# Patient Record
Sex: Female | Born: 1970 | Race: White | Hispanic: No | Marital: Single | Smoking: Current every day smoker
Health system: Southern US, Community
[De-identification: ages and names within clinical notes are randomized; demographics above are authoritative.]

## PROBLEM LIST (undated history)

## (undated) DIAGNOSIS — F32A Depression, unspecified: Secondary | ICD-10-CM

## (undated) DIAGNOSIS — K589 Irritable bowel syndrome without diarrhea: Secondary | ICD-10-CM

## (undated) DIAGNOSIS — G43909 Migraine, unspecified, not intractable, without status migrainosus: Secondary | ICD-10-CM

## (undated) DIAGNOSIS — G4700 Insomnia, unspecified: Secondary | ICD-10-CM

## (undated) DIAGNOSIS — G8929 Other chronic pain: Secondary | ICD-10-CM

## (undated) DIAGNOSIS — M549 Dorsalgia, unspecified: Secondary | ICD-10-CM

## (undated) DIAGNOSIS — K5792 Diverticulitis of intestine, part unspecified, without perforation or abscess without bleeding: Secondary | ICD-10-CM

## (undated) DIAGNOSIS — F329 Major depressive disorder, single episode, unspecified: Secondary | ICD-10-CM

## (undated) DIAGNOSIS — F419 Anxiety disorder, unspecified: Secondary | ICD-10-CM

## (undated) DIAGNOSIS — J45909 Unspecified asthma, uncomplicated: Secondary | ICD-10-CM

## (undated) HISTORY — DX: Diverticulitis of intestine, part unspecified, without perforation or abscess without bleeding: K57.92

## (undated) HISTORY — DX: Dorsalgia, unspecified: M54.9

## (undated) HISTORY — DX: Insomnia, unspecified: G47.00

## (undated) HISTORY — PX: ABDOMINAL HYSTERECTOMY: SHX81

## (undated) HISTORY — DX: Other chronic pain: G89.29

## (undated) HISTORY — DX: Depression, unspecified: F32.A

## (undated) HISTORY — DX: Irritable bowel syndrome, unspecified: K58.9

## (undated) HISTORY — DX: Migraine, unspecified, not intractable, without status migrainosus: G43.909

## (undated) HISTORY — DX: Major depressive disorder, single episode, unspecified: F32.9

## (undated) HISTORY — DX: Anxiety disorder, unspecified: F41.9

---

## 2003-12-30 ENCOUNTER — Emergency Department: Payer: Self-pay | Admitting: Emergency Medicine

## 2004-02-15 ENCOUNTER — Emergency Department: Payer: Self-pay | Admitting: Emergency Medicine

## 2004-03-16 ENCOUNTER — Emergency Department: Payer: Self-pay | Admitting: Emergency Medicine

## 2005-02-06 ENCOUNTER — Emergency Department: Payer: Self-pay | Admitting: Emergency Medicine

## 2005-02-06 ENCOUNTER — Other Ambulatory Visit: Payer: Self-pay

## 2005-06-19 ENCOUNTER — Emergency Department: Payer: Self-pay | Admitting: Emergency Medicine

## 2005-06-20 ENCOUNTER — Ambulatory Visit: Payer: Self-pay | Admitting: Emergency Medicine

## 2005-06-28 ENCOUNTER — Ambulatory Visit: Payer: Self-pay | Admitting: Obstetrics & Gynecology

## 2006-05-14 ENCOUNTER — Emergency Department: Payer: Self-pay | Admitting: Emergency Medicine

## 2006-10-10 ENCOUNTER — Other Ambulatory Visit: Payer: Self-pay

## 2006-10-10 ENCOUNTER — Emergency Department: Payer: Self-pay

## 2007-03-19 ENCOUNTER — Emergency Department: Payer: Self-pay | Admitting: Emergency Medicine

## 2007-05-18 ENCOUNTER — Emergency Department: Payer: Self-pay | Admitting: Emergency Medicine

## 2007-06-05 ENCOUNTER — Emergency Department: Payer: Self-pay | Admitting: Emergency Medicine

## 2007-08-24 ENCOUNTER — Emergency Department: Payer: Self-pay | Admitting: Internal Medicine

## 2007-09-20 ENCOUNTER — Emergency Department: Payer: Self-pay | Admitting: Emergency Medicine

## 2008-01-30 ENCOUNTER — Emergency Department: Payer: Self-pay | Admitting: Emergency Medicine

## 2008-07-19 ENCOUNTER — Emergency Department: Payer: Self-pay

## 2009-05-13 ENCOUNTER — Emergency Department: Payer: Self-pay

## 2009-11-23 ENCOUNTER — Emergency Department: Payer: Self-pay | Admitting: Emergency Medicine

## 2010-06-29 ENCOUNTER — Emergency Department: Payer: Self-pay | Admitting: Emergency Medicine

## 2010-09-26 ENCOUNTER — Emergency Department: Payer: Self-pay | Admitting: Emergency Medicine

## 2011-02-05 ENCOUNTER — Emergency Department: Payer: Self-pay | Admitting: *Deleted

## 2011-02-05 LAB — URINALYSIS, COMPLETE
Bacteria: NONE SEEN
Glucose,UR: NEGATIVE mg/dL (ref 0–75)
Ketone: NEGATIVE
Nitrite: NEGATIVE
Protein: NEGATIVE
RBC,UR: <1 /HPF (ref 0–5)
Specific Gravity: 1.002 (ref 1.003–1.030)
Squamous Epithelial: NONE SEEN
WBC UR: <1 /HPF (ref 0–5)

## 2011-03-26 ENCOUNTER — Emergency Department: Payer: Self-pay | Admitting: *Deleted

## 2011-08-07 ENCOUNTER — Emergency Department: Payer: Self-pay | Admitting: Internal Medicine

## 2011-08-07 LAB — TROPONIN I: Troponin-I: <0.02 ng/mL

## 2011-08-07 LAB — COMPREHENSIVE METABOLIC PANEL
Albumin: 3.8 g/dL (ref 3.4–5.0)
Alkaline Phosphatase: 75 U/L (ref 50–136)
Calcium, Total: 8.5 mg/dL (ref 8.5–10.1)
Chloride: 109 mmol/L — ABNORMAL HIGH (ref 98–107)
Co2: 19 mmol/L — ABNORMAL LOW (ref 21–32)
Creatinine: 0.64 mg/dL (ref 0.60–1.30)
EGFR (Non-African Amer.): >60
Glucose: 120 mg/dL — ABNORMAL HIGH (ref 65–99)
Potassium: 3.6 mmol/L (ref 3.5–5.1)
Sodium: 140 mmol/L (ref 136–145)
Total Protein: 7.4 g/dL (ref 6.4–8.2)

## 2011-08-07 LAB — TSH: Thyroid Stimulating Horm: 0.589 u[IU]/mL

## 2011-08-07 LAB — CK TOTAL AND CKMB (NOT AT ARMC): CK-MB: <0.5 ng/mL — ABNORMAL LOW (ref 0.5–3.6)

## 2011-08-07 LAB — CBC
HCT: 42.5 % (ref 35.0–47.0)
HGB: 14.4 g/dL (ref 12.0–16.0)
MCV: 93 fL (ref 80–100)
Platelet: 183 10*3/uL (ref 150–440)
WBC: 16.1 10*3/uL — ABNORMAL HIGH (ref 3.6–11.0)

## 2012-12-03 ENCOUNTER — Emergency Department: Payer: Self-pay | Admitting: Emergency Medicine

## 2012-12-04 ENCOUNTER — Emergency Department: Payer: Self-pay | Admitting: Emergency Medicine

## 2012-12-19 LAB — COMPREHENSIVE METABOLIC PANEL
Albumin: 3.9 g/dL (ref 3.4–5.0)
BUN: 6 mg/dL — ABNORMAL LOW (ref 7–18)
Bilirubin,Total: 0.4 mg/dL (ref 0.2–1.0)
Calcium, Total: 9 mg/dL (ref 8.5–10.1)
Co2: 23 mmol/L (ref 21–32)
Creatinine: 0.9 mg/dL (ref 0.60–1.30)
EGFR (African American): >60
EGFR (Non-African Amer.): >60
Osmolality: 273 (ref 275–301)
Potassium: 3.4 mmol/L — ABNORMAL LOW (ref 3.5–5.1)
SGOT(AST): 22 U/L (ref 15–37)
SGPT (ALT): 18 U/L (ref 12–78)
Total Protein: 7.3 g/dL (ref 6.4–8.2)

## 2012-12-19 LAB — CBC
HCT: 43.9 % (ref 35.0–47.0)
MCV: 91 fL (ref 80–100)
RDW: 12.8 % (ref 11.5–14.5)

## 2012-12-19 LAB — ETHANOL
Ethanol %: 0.016 % (ref 0.000–0.080)
Ethanol: 16 mg/dL

## 2012-12-20 ENCOUNTER — Inpatient Hospital Stay: Payer: Self-pay | Admitting: Psychiatry

## 2012-12-20 LAB — URINALYSIS, COMPLETE
Bacteria: NONE SEEN
Bilirubin,UR: NEGATIVE
Blood: NEGATIVE
Glucose,UR: NEGATIVE mg/dL (ref 0–75)
Ketone: NEGATIVE
Leukocyte Esterase: NEGATIVE
Ph: 6 (ref 4.5–8.0)
Specific Gravity: 1.011 (ref 1.003–1.030)
Squamous Epithelial: 3

## 2012-12-20 LAB — DRUG SCREEN, URINE
Amphetamines, Ur Screen: NEGATIVE (ref ?–1000)
Cannabinoid 50 Ng, Ur ~~LOC~~: POSITIVE (ref ?–50)
Methadone, Ur Screen: NEGATIVE (ref ?–300)
Tricyclic, Ur Screen: NEGATIVE (ref ?–1000)

## 2013-09-17 ENCOUNTER — Emergency Department: Payer: Self-pay | Admitting: Emergency Medicine

## 2013-09-17 LAB — COMPREHENSIVE METABOLIC PANEL
ALBUMIN: 3.6 g/dL (ref 3.4–5.0)
ALK PHOS: 68 U/L
ALT: 17 U/L
AST: 23 U/L (ref 15–37)
Anion Gap: 8 (ref 7–16)
BILIRUBIN TOTAL: 0.3 mg/dL (ref 0.2–1.0)
BUN: 8 mg/dL (ref 7–18)
CO2: 24 mmol/L (ref 21–32)
Calcium, Total: 8.7 mg/dL (ref 8.5–10.1)
Chloride: 106 mmol/L (ref 98–107)
Creatinine: 0.93 mg/dL (ref 0.60–1.30)
EGFR (Non-African Amer.): >60
GLUCOSE: 103 mg/dL — AB (ref 65–99)
Osmolality: 274 (ref 275–301)
Potassium: 3.5 mmol/L (ref 3.5–5.1)
Sodium: 138 mmol/L (ref 136–145)
TOTAL PROTEIN: 7.3 g/dL (ref 6.4–8.2)

## 2013-09-17 LAB — CBC WITH DIFFERENTIAL/PLATELET
BASOS ABS: 0.1 10*3/uL (ref 0.0–0.1)
Basophil %: 1 %
EOS ABS: 0.2 10*3/uL (ref 0.0–0.7)
EOS PCT: 3.1 %
HCT: 44.4 % (ref 35.0–47.0)
HGB: 14.7 g/dL (ref 12.0–16.0)
LYMPHS PCT: 21.2 %
Lymphocyte #: 1.4 10*3/uL (ref 1.0–3.6)
MCH: 31.3 pg (ref 26.0–34.0)
MCHC: 33.1 g/dL (ref 32.0–36.0)
MCV: 95 fL (ref 80–100)
Monocyte #: 0.4 x10 3/mm (ref 0.2–0.9)
Monocyte %: 6.3 %
Neutrophil #: 4.5 10*3/uL (ref 1.4–6.5)
Neutrophil %: 68.4 %
Platelet: 188 10*3/uL (ref 150–440)
RBC: 4.69 10*6/uL (ref 3.80–5.20)
RDW: 12.9 % (ref 11.5–14.5)
WBC: 6.5 10*3/uL (ref 3.6–11.0)

## 2013-09-17 LAB — URINALYSIS, COMPLETE
Bilirubin,UR: NEGATIVE
GLUCOSE, UR: NEGATIVE mg/dL (ref 0–75)
Ketone: NEGATIVE
Nitrite: NEGATIVE
PH: 6 (ref 4.5–8.0)
Protein: NEGATIVE
Specific Gravity: 1.011 (ref 1.003–1.030)
Squamous Epithelial: 7

## 2013-09-17 LAB — PREGNANCY, URINE: Pregnancy Test, Urine: NEGATIVE m[IU]/mL

## 2013-09-17 LAB — LIPASE, BLOOD: Lipase: 219 U/L (ref 73–393)

## 2013-11-16 ENCOUNTER — Emergency Department: Payer: Self-pay | Admitting: Emergency Medicine

## 2013-11-16 LAB — COMPREHENSIVE METABOLIC PANEL
ALT: 19 U/L
AST: 17 U/L (ref 15–37)
Albumin: 3.1 g/dL — ABNORMAL LOW (ref 3.4–5.0)
Alkaline Phosphatase: 64 U/L
Anion Gap: 4 — ABNORMAL LOW (ref 7–16)
BUN: 7 mg/dL (ref 7–18)
Bilirubin,Total: 0.2 mg/dL (ref 0.2–1.0)
CALCIUM: 8.2 mg/dL — AB (ref 8.5–10.1)
CREATININE: 0.96 mg/dL (ref 0.60–1.30)
Chloride: 109 mmol/L — ABNORMAL HIGH (ref 98–107)
Co2: 30 mmol/L (ref 21–32)
EGFR (Non-African Amer.): >60
Glucose: 113 mg/dL — ABNORMAL HIGH (ref 65–99)
OSMOLALITY: 284 (ref 275–301)
Potassium: 3.8 mmol/L (ref 3.5–5.1)
Sodium: 143 mmol/L (ref 136–145)
Total Protein: 5.9 g/dL — ABNORMAL LOW (ref 6.4–8.2)

## 2013-11-16 LAB — URINALYSIS, COMPLETE
Bilirubin,UR: NEGATIVE
Glucose,UR: NEGATIVE mg/dL (ref 0–75)
Ketone: NEGATIVE
Leukocyte Esterase: NEGATIVE
Nitrite: NEGATIVE
PROTEIN: NEGATIVE
Ph: 7 (ref 4.5–8.0)
RBC,UR: 59 /HPF (ref 0–5)
SPECIFIC GRAVITY: 1.016 (ref 1.003–1.030)

## 2013-11-16 LAB — CBC WITH DIFFERENTIAL/PLATELET
BASOS ABS: 0.1 10*3/uL (ref 0.0–0.1)
Basophil %: 1.2 %
Eosinophil #: 0.2 10*3/uL (ref 0.0–0.7)
Eosinophil %: 2 %
HCT: 42.1 % (ref 35.0–47.0)
HGB: 13.7 g/dL (ref 12.0–16.0)
LYMPHS ABS: 2 10*3/uL (ref 1.0–3.6)
LYMPHS PCT: 25.3 %
MCH: 31.1 pg (ref 26.0–34.0)
MCHC: 32.6 g/dL (ref 32.0–36.0)
MCV: 96 fL (ref 80–100)
MONO ABS: 0.5 x10 3/mm (ref 0.2–0.9)
Monocyte %: 5.8 %
Neutrophil #: 5.3 10*3/uL (ref 1.4–6.5)
Neutrophil %: 65.7 %
PLATELETS: 200 10*3/uL (ref 150–440)
RBC: 4.41 10*6/uL (ref 3.80–5.20)
RDW: 12.9 % (ref 11.5–14.5)
WBC: 8 10*3/uL (ref 3.6–11.0)

## 2013-11-16 LAB — LIPASE, BLOOD: Lipase: 259 U/L (ref 73–393)

## 2013-11-25 ENCOUNTER — Emergency Department: Payer: Self-pay | Admitting: Emergency Medicine

## 2014-05-14 NOTE — H&P (Signed)
PATIENT NAME:  Jessica Mathews, Jessica J MR#:  [id number] DATE OF BIRTH:  09/14/1970  DATE OF ADMISSION:  12/20/2012  REFERRING PHYSICIAN: Emergency Room MD   ATTENDING PHYSICIAN: Jessica Reicks B. Jennet MaduroPucilowska, MD   IDENTIFYING DATA: Jessica Mathews is a 44 year old female with no past psychiatric history.   CHIEF COMPLAINT: "I was arguing with my boyfriend."  HISTORY OF PRESENT ILLNESS: Jessica Mathews has no psychiatric past. She was brought to the emergency room by police after her children called them. She reportedly was arguing with her boyfriend and made some statements that were taken as suicidal threats. The patient adamantly denies any intention or plans to hurt to herself or anybody else. She denies ever mentioning suicide. She was also noted to have a cut on her wrist, which was interpreted as a suicide attempt. It looks old. The patient cut it when she was patching a broken window with some sheets and plastic bags; otherwise, the house would be cold. She denies any symptoms of depression, anxiety, psychosis. She denies symptoms suggestive of bipolar mania. She does not use alcohol, illicit drugs, or prescription pills. She reports that she has been with her boyfriend, who is [age] years old, for 3 years. They do better when they do not live together. He is a drinker and when drunk he can be difficult. She moved in with him together with her daughter, who is 45 years old and has an 5263-month baby. Now her older daughter, who is 3624, also moved in with her 2 children, ages 1073 and 315. Both girls work. 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 The boyfriend is on the lease. Yesterday's argument was about them staying together, and the patient decided that probably it is better when they have a long distance relationship. She intends to switch lease into her daughter's name, and the girls will pay the rent. The boyfriend will go to live with his mother. At least this is the plan for today.   PAST PSYCHIATRIC HISTORY: She has never been  hospitalized. No suicide attempts. No substance abuse treatment.   FAMILY PSYCHIATRIC HISTORY: None reported.   PAST MEDICAL HISTORY: None.   ALLERGIES: ZITHROMAX.   MEDICATIONS ON ADMISSION: None.   SOCIAL HISTORY: She used to be a LawyerCNA and medication nurse. She got into some trouble being at the wrong place at the wrong time, charged with felony, lost her licenses. 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 She has no health insurance.   REVIEW OF SYSTEMS:    CONSTITUTIONAL: No fevers or chills. No weight changes.  EYES: No double or blurred vision.  ENT: No hearing loss. RESPIRATORY: No shortness of breath.  CARDIOVASCULAR: No chest pain or orthopnea.  GASTROINTESTINAL: No abdominal pain, nausea, vomiting, or diarrhea.  GENITOURINARY: No incontinence or frequency.  ENDOCRINE: No heat or cold intolerance.  LYMPHATIC: No anemia or easy bruising.  INTEGUMENTARY: No acne or rash.  MUSCULOSKELETAL: No muscle or joint pain.  NEUROLOGIC: No tingling or weakness.  PSYCHIATRIC: See history of present illness for details.   PHYSICAL EXAMINATION: VITAL SIGNS: Blood pressure 140/91, pulse 69, respirations 20, temperature 98.3.  GENERAL: This is a well-developed female in no acute distress.  HEENT: The pupils are equal, round, and reactive to light. Sclerae anicteric.  NECK: Supple. No thyromegaly.  LUNGS: Clear to auscultation. No dullness to percussion.  HEART: Regular rhythm and rate. No murmurs, rubs, or gallops.  ABDOMEN: Soft, nontender, nondistended. Positive bowel sounds.  MUSCULOSKELETAL: Normal muscle strength in all extremities.  SKIN: No rashes or bruises.  LYMPHATIC: No cervical adenopathy.  NEUROLOGIC: Cranial nerves II through XII are intact.   LABORATORY DATA: Chemistries are within normal limits except for potassium of 3.4. Blood alcohol level is 0.016. LFTs within normal limits. Urine drug screen positive for benzodiazepines and cannabinoids. CBC within  normal limits. Urinalysis is not suggestive of urinary tract infection. Serum acetaminophen and salicylates are low.   MENTAL STATUS EXAMINATION: The patient is alert and oriented to person, place, time, and situation. She is pleasant, polite, and cooperative. She maintains good eye contact. She is well groomed and casually dressed. Her speech is of normal rhythm, rate, and volume. Mood is good with full affect. Thought process is logical and goal oriented. Thought content: She denies suicidal or homicidal ideation. There are no delusions or paranoia. There are no auditory or visual hallucinations. Her cognition is grossly intact. She registers 3 out of 3 and recalls 3 out of 3 objects after 5 minutes. She can spell "world" forwards and backwards. She knows the current president. Her insight and judgment are fair.   SUICIDE RISK ASSESSMENT: This is a patient with no past psychiatric history who was brought to the hospital for making threats that were interpreted as suicidal in the context of relationship problems. She adamantly denies any thoughts, intention, or plans of hurting herself or others. She is forward thinking. She is a loving mother and grandmother.   DIAGNOSES: AXIS I: Adjustment disorder with depressed mood; benzodiazepine abuse; cannabis abuse.  AXIS II: Deferred.  AXIS III: Deferred.  AXIS IV: Relationship problems.  AXIS V: Global Assessment of Functioning 60.   PLAN: The patient was admitted to [facility] Medicine Unit for safety, stabilization, and medication management. She was initially placed on suicide precautions and was closely monitored for any unsafe behaviors. She underwent full psychiatric and risk assessment. She received pharmacotherapy, individual and group psychotherapy, substance abuse counseling, and support from therapeutic milieu.  1.  Suicidal ideation: The patient adamantly denies. There is a scratch on her wrist that looks old and  the patient denies that it was self-inflicted, rather, an accident.  2.  Substance abuse: The patient admits that she drinks socially. She smokes a lot of cigarettes and at times marijuana. She was positive for benzodiazepines. She says that when upset yesterday, she was given 1 Xanax by a friend. Denies problems with substances and declines substance abuse treatment.  3.  Disposition: She is discharged to home with her children.  4.  Medications at the time of discharge: None.  5.  She was given information about [employer].     ____________________________ Ellin Goodie. Jennet Maduro, MD jbp:jcm D: 12/20/2012 14:38:19 ET T: 12/20/2012 15:01:46 ET JOB#: [id number]  cc: Jaysean Manville B. Jennet Maduro, MD, <Dictator> Shari Prows MD ELECTRONICALLY SIGNED 12/23/2012 8:08

## 2014-09-30 ENCOUNTER — Encounter (HOSPITAL_COMMUNITY): Payer: Self-pay | Admitting: *Deleted

## 2014-09-30 DIAGNOSIS — R109 Unspecified abdominal pain: Secondary | ICD-10-CM | POA: Insufficient documentation

## 2014-09-30 DIAGNOSIS — J45909 Unspecified asthma, uncomplicated: Secondary | ICD-10-CM | POA: Insufficient documentation

## 2014-09-30 DIAGNOSIS — R112 Nausea with vomiting, unspecified: Secondary | ICD-10-CM | POA: Insufficient documentation

## 2014-09-30 DIAGNOSIS — R6883 Chills (without fever): Secondary | ICD-10-CM | POA: Insufficient documentation

## 2014-09-30 DIAGNOSIS — M545 Low back pain: Secondary | ICD-10-CM | POA: Insufficient documentation

## 2014-09-30 DIAGNOSIS — Z720 Tobacco use: Secondary | ICD-10-CM | POA: Insufficient documentation

## 2014-09-30 NOTE — ED Notes (Signed)
Pt c./o back pain, hematuria and hx of kidney stones.

## 2014-10-01 ENCOUNTER — Emergency Department (HOSPITAL_COMMUNITY): Payer: Self-pay

## 2014-10-01 ENCOUNTER — Emergency Department (HOSPITAL_COMMUNITY)
Admission: EM | Admit: 2014-10-01 | Discharge: 2014-10-01 | Disposition: A | Discharge status: Home / Self Care | Payer: Self-pay | Attending: Emergency Medicine | Admitting: Emergency Medicine

## 2014-10-01 ENCOUNTER — Encounter (HOSPITAL_COMMUNITY): Payer: Self-pay

## 2014-10-01 DIAGNOSIS — R109 Unspecified abdominal pain: Secondary | ICD-10-CM

## 2014-10-01 HISTORY — DX: Unspecified asthma, uncomplicated: J45.909

## 2014-10-01 LAB — URINALYSIS, ROUTINE W REFLEX MICROSCOPIC
Bilirubin Urine: NEGATIVE
Glucose, UA: NEGATIVE mg/dL
Ketones, ur: NEGATIVE mg/dL
NITRITE: NEGATIVE
PROTEIN: NEGATIVE mg/dL
SPECIFIC GRAVITY, URINE: 1.006 (ref 1.005–1.030)
UROBILINOGEN UA: 0.2 mg/dL (ref 0.0–1.0)
pH: 6 (ref 5.0–8.0)

## 2014-10-01 LAB — I-STAT CHEM 8, ED
BUN: 11 mg/dL (ref 6–20)
CALCIUM ION: 1.17 mmol/L (ref 1.12–1.23)
CREATININE: 0.9 mg/dL (ref 0.44–1.00)
Chloride: 101 mmol/L (ref 101–111)
GLUCOSE: 84 mg/dL (ref 65–99)
HCT: 42 % (ref 36.0–46.0)
HEMOGLOBIN: 14.3 g/dL (ref 12.0–15.0)
Potassium: 3.8 mmol/L (ref 3.5–5.1)
Sodium: 139 mmol/L (ref 135–145)
TCO2: 25 mmol/L (ref 0–100)

## 2014-10-01 LAB — URINE MICROSCOPIC-ADD ON

## 2014-10-01 MED ORDER — HYDROCODONE-ACETAMINOPHEN 5-325 MG PO TABS: 1.0000 | ORAL_TABLET | Freq: Two times a day (BID) | ORAL | Status: DC | PRN

## 2014-10-01 MED ORDER — SODIUM CHLORIDE 0.9 % IV BOLUS (SEPSIS)
1000.0000 mL | Freq: Once | INTRAVENOUS | Status: AC
  Administered 2014-10-01: 1000 mL via INTRAVENOUS

## 2014-10-01 MED ORDER — KETOROLAC TROMETHAMINE 30 MG/ML IJ SOLN
30.0000 mg | Freq: Once | INTRAMUSCULAR | Status: AC
  Administered 2014-10-01: 30 mg via INTRAVENOUS
  Filled 2014-10-01: qty 1

## 2014-10-01 MED ORDER — ONDANSETRON HCL 4 MG/2ML IJ SOLN
4.0000 mg | Freq: Once | INTRAMUSCULAR | Status: AC
  Administered 2014-10-01: 4 mg via INTRAVENOUS
  Filled 2014-10-01: qty 2

## 2014-10-01 MED ORDER — MORPHINE SULFATE (PF) 4 MG/ML IV SOLN
6.0000 mg | Freq: Once | INTRAVENOUS | Status: AC
  Administered 2014-10-01: 6 mg via INTRAVENOUS
  Filled 2014-10-01: qty 2

## 2014-10-01 NOTE — Discharge Instructions (Signed)
Flank Pain Ms. Jessica Mathews, You do not have any kidney stones.  See urology within 3 days for close follow up.  If symptoms worsen, come back to the ED immediately.  Thank you. Flank pain is pain in your side. The flank is the area of your side between your upper belly (abdomen) and your back. Pain in this area can be caused by many different things. HOME CARE Home care and treatment will depend on the cause of your pain.  Rest as told by your doctor.  Drink enough fluids to keep your pee (urine) clear or pale yellow.  Only take medicine as told by your doctor.  Tell your doctor about any changes in your pain.  Follow up with your doctor. GET HELP RIGHT AWAY IF:   Your pain does not get better with medicine.   You have new symptoms or your symptoms get worse.  Your pain gets worse.   You have belly (abdominal) pain.   You are short of breath.   You always feel sick to your stomach (nauseous).   You keep throwing up (vomiting).   You have puffiness (swelling) in your belly.   You feel light-headed or you pass out (faint).   You have blood in your pee.  You have a fever or lasting symptoms for more than 2-3 days.  You have a fever and your symptoms suddenly get worse. MAKE SURE YOU:   Understand these instructions.  Will watch your condition.  Will get help right away if you are not doing well or get worse. Document Released: 10/18/2007 Document Revised: 05/25/2013 Document Reviewed: 08/23/2011 [facility] Patient Information 2015 [place], [place]. This information is not intended to replace advice given to you by your health care provider. Make sure you discuss any questions you have with your health care provider.

## 2014-10-01 NOTE — ED Notes (Signed)
Pt verbalized understanding of d/c instructions and has no further questions. Pt stable and NAD. Pt discharged home with family member driving.

## 2014-10-01 NOTE — ED Provider Notes (Signed)
CSN: [id number]     Arrival date & time 09/30/14  2346 History   This chart was scribed for Tomasita Crumble, MD by Evon Slack, ED Scribe. This patient was seen in room A03C/A03C and the patient's care was started at 3:11 AM.     Chief Complaint  Patient presents with  . Back Pain  . Hematuria   Patient is a 44 y.o. female presenting with back pain. The history is provided by the patient. No language interpreter was used.  Back Pain Location:  Lumbar spine Pain severity:  Moderate Onset quality:  Gradual Duration:  3 days Timing:  Constant Relieved by:  None tried  HPI Comments: Jessica Mathews is a 44 y.o. female who presents to the Emergency Department complaining of right sided low back pain onset 3 days prior. Pt reports associated hematuria. She states that when she urinates it is a sharp pressure. Pt reports chills, nausea and vomiting. Pt states she has a Hx of kidney stones and the pain feels similar. Denies Hx previous back surgeries.   Past Medical History  Diagnosis Date  . Asthma    Past Surgical History  Procedure Laterality Date  . Abdominal hysterectomy     No family history on file. Social History  Substance Use Topics  . Smoking status: Current Every Day Smoker -- 0.50 packs/day    Types: Cigarettes  . Smokeless tobacco: None  . Alcohol Use: No   OB History    No data available     Review of Systems  Constitutional: Positive for chills.  Gastrointestinal: Positive for nausea and vomiting.  Genitourinary: Positive for hematuria.  Musculoskeletal: Positive for back pain.  All other systems reviewed and are negative.    Allergies  Zithromax  Home Medications   Prior to Admission medications   Not on File   BP 120/82 mmHg  Pulse 71  Temp(Src) 98.5 F (36.9 C) (Oral)  Resp 16  Ht 5\' 5"  (1.651 m)  Wt 177 lb (80.287 kg)  BMI 29.45 kg/m2  SpO2 96%   Physical Exam  Constitutional: She is oriented to person, place, and time. She appears  well-developed and well-nourished. No distress.  HENT:  Head: Normocephalic and atraumatic.  Eyes: Conjunctivae and EOM are normal.  Neck: Neck supple. No tracheal deviation present.  Cardiovascular: Normal rate.   Pulmonary/Chest: Effort normal. No respiratory distress.  Abdominal: There is CVA tenderness (Right).  Musculoskeletal: Normal range of motion.  Neurological: She is alert and oriented to person, place, and time.  Skin: Skin is warm and dry.  Psychiatric: She has a normal mood and affect. Her behavior is normal.  Nursing note and vitals reviewed.   ED Course  Procedures (including critical care time) DIAGNOSTIC STUDIES: Oxygen Saturation is 96% on RA, adequate by my interpretation.    COORDINATION OF CARE: 3:14 AM-Discussed treatment plan with pt at bedside and pt agreed to plan.     Labs Review Labs Reviewed  URINALYSIS, ROUTINE W REFLEX MICROSCOPIC (NOT AT [facility]) - Abnormal; Notable for the following:    Hgb urine dipstick LARGE (*)    Leukocytes, UA TRACE (*)    All other components within normal limits  URINE MICROSCOPIC-ADD ON  I-STAT CHEM 8, ED    Imaging Review Ct Renal Stone Study  10/01/2014   CLINICAL DATA:  Right flank pain and gross hematuria. History of renal stones.  EXAM: CT ABDOMEN AND PELVIS WITHOUT CONTRAST  TECHNIQUE: Multidetector CT imaging of the abdomen and pelvis  was performed following the standard protocol without IV contrast.  COMPARISON:  None.  FINDINGS: 4 mm nodule in the left lung base. If the patient is at high risk for bronchogenic carcinoma, follow-up chest CT at 1 year is recommended. If the patient is at low risk, no follow-up is needed. This recommendation follows the consensus statement: Guidelines for Management of Small Pulmonary Nodules Detected on CT Scans: A Statement from the Fleischner Society as published in Radiology 2005; 237:395-400.  Kidneys are symmetrical in size and shape. No hydronephrosis or hydroureter. No renal,  ureteral, or bladder stones.  The unenhanced appearance of the liver, spleen, gallbladder, pancreas, adrenal glands, abdominal aorta, inferior vena cava, and retroperitoneal lymph nodes is unremarkable. Stomach, small bowel, and colon are not abnormally distended. Stool fills the colon. No free air or free fluid in the abdomen. Abdominal wall musculature appears intact.  Pelvis: Uterus is surgically absent. Appendix is normal. Diverticula in the sigmoid colon without evidence of diverticulitis. No free or loculated pelvic fluid collections. No pelvic mass or lymphadenopathy. No destructive bone lesions.  IMPRESSION: No renal or ureteral stone or obstruction.   Electronically Signed   By: Burman Nieves M.D.   On: 10/01/2014 04:04      EKG Interpretation None      MDM   Final diagnoses:  None    Patient presents to the ED for r flank pain and is concerned for kidney stones.  She states this feels like past kidney stones.  She has never been here and normally goes to [facility].  She was given IVF, zofran, and morphine and toradol for control.  CT is negative for kidney stones.  She continues to ask for more pain medication.  She was given another dose of morphine.    She was advised and CT findings. Urology follow-up be given to her. Patient is requesting pain medication to go home with, will give 6 tablets of Norco. She otherwise appears well-developed acute distress. Vital signs within her normal limits and she is safe for discharge.  I personally performed the services described in this documentation, which was scribed in my presence. The recorded information has been reviewed and is accurate.     Tomasita Crumble, MD 10/01/14 [phone]

## 2014-10-30 ENCOUNTER — Emergency Department (HOSPITAL_COMMUNITY): Payer: No Typology Code available for payment source

## 2014-10-30 ENCOUNTER — Encounter (HOSPITAL_COMMUNITY): Payer: Self-pay | Admitting: Nurse Practitioner

## 2014-10-30 ENCOUNTER — Emergency Department (HOSPITAL_COMMUNITY)
Admission: EM | Admit: 2014-10-30 | Discharge: 2014-10-30 | Disposition: A | Discharge status: Home / Self Care | Payer: No Typology Code available for payment source | Attending: Emergency Medicine | Admitting: Emergency Medicine

## 2014-10-30 DIAGNOSIS — S4992XA Unspecified injury of left shoulder and upper arm, initial encounter: Secondary | ICD-10-CM | POA: Insufficient documentation

## 2014-10-30 DIAGNOSIS — Z720 Tobacco use: Secondary | ICD-10-CM | POA: Diagnosis not present

## 2014-10-30 DIAGNOSIS — J45909 Unspecified asthma, uncomplicated: Secondary | ICD-10-CM | POA: Diagnosis not present

## 2014-10-30 DIAGNOSIS — Y92410 Unspecified street and highway as the place of occurrence of the external cause: Secondary | ICD-10-CM | POA: Diagnosis not present

## 2014-10-30 DIAGNOSIS — S199XXA Unspecified injury of neck, initial encounter: Secondary | ICD-10-CM | POA: Insufficient documentation

## 2014-10-30 DIAGNOSIS — Y9389 Activity, other specified: Secondary | ICD-10-CM | POA: Diagnosis not present

## 2014-10-30 DIAGNOSIS — Y998 Other external cause status: Secondary | ICD-10-CM | POA: Insufficient documentation

## 2014-10-30 DIAGNOSIS — S3992XA Unspecified injury of lower back, initial encounter: Secondary | ICD-10-CM | POA: Insufficient documentation

## 2014-10-30 DIAGNOSIS — M25512 Pain in left shoulder: Secondary | ICD-10-CM

## 2014-10-30 MED ORDER — DIAZEPAM 5 MG PO TABS: 5.0000 mg | ORAL_TABLET | Freq: Two times a day (BID) | ORAL | Status: DC | PRN

## 2014-10-30 MED ORDER — IBUPROFEN 800 MG PO TABS: 800.0000 mg | ORAL_TABLET | Freq: Three times a day (TID) | ORAL | Status: DC

## 2014-10-30 MED ORDER — OXYCODONE-ACETAMINOPHEN 5-325 MG PO TABS
2.0000 | ORAL_TABLET | Freq: Once | ORAL | Status: AC
  Administered 2014-10-30: 2 via ORAL
  Filled 2014-10-30: qty 2

## 2014-10-30 MED ORDER — DIAZEPAM 5 MG PO TABS
10.0000 mg | ORAL_TABLET | Freq: Once | ORAL | Status: AC
Start: 2014-10-30 — End: 2014-10-30
  Administered 2014-10-30: 10 mg via ORAL
  Filled 2014-10-30: qty 2

## 2014-10-30 NOTE — Discharge Instructions (Signed)
1. Medications: alternate naprosyn and tylenol for pain control, Use valium for muscle spasms, usual home medications 2. Treatment: rest, ice, elevate and use brace, drink plenty of fluids, gentle stretching 3. Follow Up: Please followup with orthopedics as directed or your PCP in 1 week if no improvement for discussion of your diagnoses and further evaluation after today's visit; if you do not have a primary care doctor use the resource guide provided to find one; Please return to the ER for worsening symptoms or other concerns    Cryotherapy Cryotherapy is when you put ice on your injury. Ice helps lessen pain and puffiness (swelling) after an injury. Ice works the best when you start using it in the first 24 to 48 hours after an injury. HOME CARE  Put a dry or damp towel between the ice pack and your skin.  You may press gently on the ice pack.  Leave the ice on for no more than 10 to 20 minutes at a time.  Check your skin after 5 minutes to make sure your skin is okay.  Rest at least 20 minutes between ice pack uses.  Stop using ice when your skin loses feeling (numbness).  Do not use ice on someone who cannot tell you when it hurts. This includes small children and people with memory problems (dementia). GET HELP RIGHT AWAY IF:  You have white spots on your skin.  Your skin turns blue or pale.  Your skin feels waxy or hard.  Your puffiness gets worse. MAKE SURE YOU:   Understand these instructions.  Will watch your condition.  Will get help right away if you are not doing well or get worse.   This information is not intended to replace advice given to you by your health care provider. Make sure you discuss any questions you have with your health care provider.   Document Released: 06/27/2007 Document Revised: 04/02/2011 Document Reviewed: 08/31/2010 Elsevier Interactive Patient Education 2016 [employer].    Emergency Department Resource Guide 1) Find a Doctor  and Pay Out of Pocket Although you won't have to find out who is covered by your insurance plan, it is a good idea to ask around and get recommendations. You will then need to call the office and see if the doctor you have chosen will accept you as a new patient and what types of options they offer for patients who are self-pay. Some doctors offer discounts or will set up payment plans for their patients who do not have insurance, but you will need to ask so you aren't surprised when you get to your appointment.  2) Contact Your Local Health Department Not all health departments have doctors that can see patients for sick visits, but many do, so it is worth a call to see if yours does. If you don't know where your local health department is, you can check in your phone book. The CDC also has a tool to help you locate your state's health department, and many state websites also have listings of all of their local health departments.  3) Find a Walk-in Clinic If your illness is not likely to be very severe or complicated, you may want to try a walk in clinic. These are popping up all over the country in pharmacies, drugstores, and shopping centers. They're usually staffed by nurse practitioners or physician assistants that have been trained to treat common illnesses and complaints. They're usually fairly quick and inexpensive. However, if you have serious medical  issues or chronic medical problems, these are probably not your best option.  No Primary Care Doctor: - Call Health Connect at  [phone] - they can help you locate a primary care doctor that  accepts your insurance, provides certain services, etc. - Physician Referral Service- [phone]  Chronic Pain Problems: Organization         Address  Phone   Notes  [facility]  [phone] Patients need to be referred by their primary care doctor.   Medication Assistance: Organization         Address  Phone    Notes  [facility] Medication [facility] [address] [place], [place] [address] [phone] --Must be a resident of [facility] -- Must have NO insurance coverage whatsoever (no Medicaid/ Medicare, etc.) -- The pt. MUST have a primary care doctor that directs their care regularly and follows them in the community   Med[facility]ist  [phone]   [employer]  [phone]    Agencies that provide inexpensive medical care: Organization         Address  Phone   Notes  [place]  [phone]   [facility]    [phone]   [facility] [place], [place] [address] [phone]   [place] [address] [place] [phone]   Planned Parenthood    ([phone]   [facility]    [phone]   [place] and [address], [place] Phone:  [phone], Fax:  [phone] Hours of Operation:  9 am - 6 pm, M-F.  Also accepts Medicaid/Medicare and self-pay.  [facility] for [place], [address], [place] Phone: [phone], Fax: [phone]. Hours of Operation:  8:30 am - 5:30 pm, M-F.  Also accepts Medicaid and self-pay.  [facility] [place] [address], [place] Phone: [phone]   [place], [place], [place] [phone], Ext. [phone] Mondays & Thursdays: 7-9 AM.  First 15 patients are seen on a first come, first serve basis.    [place] Providers:  Organization         Address  Phone   Notes  [facility] [address], [place] [phone] Also accepts self-pay patients.  [facility] [address], [place]  [phone]   [place], [address], [place] [phone]   [facility] [address] [place] [phone]   Lucianne Lei [address], [place]   [phone] Only accepts [place] Access [place] patients after they have their name applied to their card.   Self-Pay (no insurance) in [place]:  Organization         Address  Phone   Notes  Sickle Cell Patients, [facility] [place] [phone]   [facility] [place] [phone]   [facility] [place]  [address], [address], [place] [phone]   [facility]/Dr. Osei-Bonsu  [address], [place] or [address], [place] [phone] Phone number for both West and [place] locations is the  same.  [facility] [address] [place] [phone]   [facility] [address] [place] or [address] [phone]   [facility] [address], [place] [phone], phone; [phone], fax Sees patients 1st and 3rd Saturday of every month.  Must not qualify for public or private insurance (i.e. Medicaid, Medicare, [place] Health Choice, Veterans' Benefits)  Household income should be no more than 200% of the poverty level The clinic cannot treat you if you are pregnant or think you are pregnant  Sexually transmitted diseases are not treated at the clinic.    Dental Care: Organization         Address  Phone  Notes  [facility] Department of [facility] [place] [phone] Accepts children up to age 31 who are enrolled in [place] or [place]; pregnant women with a Medicaid card; and children who have applied for Medicaid or [place] Health Choice, but were declined, whose parents can pay a reduced fee at time of service.  [facility]  [address], [place] [phone] Accepts children up to age 34 who are enrolled in [place] or [place]; pregnant women with a Medicaid card; and children who have applied for Medicaid or [place] Health Choice, but were declined, whose parents can pay a reduced fee at time of service.  [place] Adult Dental Access PROGRAM  [place] [phone] Patients are seen by appointment only. Walk-ins are not accepted. [place] will see patients 70 years of age and older. Monday - Tuesday (8am-5pm) Most Wednesdays (8:30-5pm) $30 per visit, cash only  [facility] Adult Dental Access PROGRAM  [address], [facility] [phone] Patients are seen by appointment only. Walk-ins are not accepted. [place] will see patients 38 years of age and older. One Wednesday Evening (Monthly: Volunteer Based).  $30 per visit, cash only  [place]  [phone] for adults; Children under age 66, call Graduate Pediatric Dentistry at [phone]. Children aged 18-14, please call [phone] to request a pediatric application.  Dental services are provided in all areas of dental care including fillings, crowns and bridges, complete and partial dentures, implants, gum treatment, root canals, and extractions. Preventive care is also provided. Treatment is provided to both adults and children. Patients are selected via a lottery and there is often a waiting list.   [facility] [address], [place]  [phone] [email or web address]   [facility] [address] [place], [place] [phone], Ext. [phone] Second and Fourth Thursday of each month, opens at 6:30 AM; Clinic ends at 9 AM.  Patients are seen on a first-come first-served basis, and a limited number are seen during each clinic.   [facility]  [address] Hill[place] Danker [place], [place] [phone]   Eligibility Requirements You must have lived in  [place], [place], or [place] counties for at least the last three months.   You cannot be eligible for state or federal sponsored [employer], including [employer], [place], or [employer].   You generally cannot be eligible for healthcare insurance through your employer.    How to apply: Eligibility screenings are held every Tuesday and Wednesday afternoon from 1:00 pm until 4:00 pm. You do not need an appointment for  the interview!  [facility] [address], [place], [place]   [place]  [place] Department  [place]  [phone]    Behavioral Health Resources in the Community: Intensive Outpatient Programs Organization         Address  Phone  Notes  [place]. [address], [place], [place] [phone]   [facility] [address] [place], [place]   ADS: Alcohol & Drug Svcs [address] [place], [place]   [place] [address]  [place], [place] or [phone]   Substance Abuse Resources Organization         Address  Phone  Notes  Al[facility] Drug Services  [phone]   [place]  [phone]   [facility] [place]   Chinita Pester  [phone]   Residential & Outpatient Substance Abuse Program  [phone]   Psychological Services Organization         Address  Phone  Notes  [facility] [place]  [place]  [phone]   [place] [address], [place] or [phone]    Mobile Crisis Teams Organization         Address  Phone  Notes  [facility], Mobile Crisis Care Unit  [phone]   [facility]  [address] [place], [place]   [place] [address], [place] [phone]    Self-Help/Support Groups Organization         Address  Phone             Notes  [place]. of [place] - variety of support groups  [place] Call for more information  Narcotics Anonymous (NA), [facility] [address], [employer] [place]  2 meetings at this location   Special educational needs teacher         Address  Phone  Notes  [facility]idential [facility] [place],    [place]  [phone]   [facility]  [address], [place] [address], [place], [place]   [place], [place] [phone] Admissions: 8am-3pm M-F  Incentives Substance [place] [address],    [place], [place] [phone]   [facility] [address] [place], [place], [place]   [facility] [address],  [place], [place]   [facility] - Intensive Outpatient [address]een [address], [place], [place]   [facility].,  [place], [place] [phone] or [phone]   [facility] ([facility]) [address], [place], [place] Accepts Medicaid  [facility] [address]  [place] [phone] Substance Abuse/Addiction Treatment   [facility] Organization         Address  Phone  Notes  [facility]  [phone]   Domenic Schwab, PhD [address] Arlis [place], [place]   [phone] or [phone]   [facility], [place] [phone]   [place] [address], [place], [place] [phone] Insurance/Medicaid/sponsorship through [employer] and Families [address]  [place], [place] [phone] [place], [place] [phone]    Dr. Adele Schilder  [phone]   [facility] Dept. 1) [address] [place] 2) [place] 3)  [place] [address], [place] [phone]  [phone]   [place] [phone] or [phone] (After Hours)
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
## 2014-10-30 NOTE — ED Notes (Signed)
Patient transported to X-ray 

## 2015-03-09 ENCOUNTER — Emergency Department
Admission: EM | Admit: 2015-03-09 | Discharge: 2015-03-09 | Disposition: A | Discharge status: Home / Self Care | Payer: Self-pay | Attending: Emergency Medicine | Admitting: Emergency Medicine

## 2015-03-09 ENCOUNTER — Emergency Department: Payer: Self-pay

## 2015-03-09 ENCOUNTER — Encounter: Payer: Self-pay | Admitting: Emergency Medicine

## 2015-03-09 DIAGNOSIS — F419 Anxiety disorder, unspecified: Secondary | ICD-10-CM | POA: Insufficient documentation

## 2015-03-09 DIAGNOSIS — Y998 Other external cause status: Secondary | ICD-10-CM | POA: Insufficient documentation

## 2015-03-09 DIAGNOSIS — R0789 Other chest pain: Secondary | ICD-10-CM

## 2015-03-09 DIAGNOSIS — S299XXA Unspecified injury of thorax, initial encounter: Secondary | ICD-10-CM | POA: Insufficient documentation

## 2015-03-09 DIAGNOSIS — F17210 Nicotine dependence, cigarettes, uncomplicated: Secondary | ICD-10-CM | POA: Insufficient documentation

## 2015-03-09 DIAGNOSIS — Y9389 Activity, other specified: Secondary | ICD-10-CM | POA: Insufficient documentation

## 2015-03-09 DIAGNOSIS — Z79899 Other long term (current) drug therapy: Secondary | ICD-10-CM | POA: Insufficient documentation

## 2015-03-09 DIAGNOSIS — Y92008 Other place in unspecified non-institutional (private) residence as the place of occurrence of the external cause: Secondary | ICD-10-CM | POA: Insufficient documentation

## 2015-03-09 DIAGNOSIS — X58XXXA Exposure to other specified factors, initial encounter: Secondary | ICD-10-CM | POA: Insufficient documentation

## 2015-03-09 LAB — CBC
HCT: 42.9 % (ref 35.0–47.0)
HEMOGLOBIN: 14.3 g/dL (ref 12.0–16.0)
MCH: 30.7 pg (ref 26.0–34.0)
MCHC: 33.3 g/dL (ref 32.0–36.0)
MCV: 92.2 fL (ref 80.0–100.0)
Platelets: 166 10*3/uL (ref 150–440)
RBC: 4.66 MIL/uL (ref 3.80–5.20)
RDW: 12.8 % (ref 11.5–14.5)
WBC: 7 10*3/uL (ref 3.6–11.0)

## 2015-03-09 LAB — COMPREHENSIVE METABOLIC PANEL
ALK PHOS: 53 U/L (ref 38–126)
ALT: 15 U/L (ref 14–54)
ANION GAP: 7 (ref 5–15)
AST: 19 U/L (ref 15–41)
Albumin: 4.1 g/dL (ref 3.5–5.0)
BILIRUBIN TOTAL: 0.5 mg/dL (ref 0.3–1.2)
BUN: 10 mg/dL (ref 6–20)
CALCIUM: 9.3 mg/dL (ref 8.9–10.3)
CO2: 26 mmol/L (ref 22–32)
Chloride: 108 mmol/L (ref 101–111)
Creatinine, Ser: 0.92 mg/dL (ref 0.44–1.00)
Glucose, Bld: 102 mg/dL — ABNORMAL HIGH (ref 65–99)
Potassium: 4 mmol/L (ref 3.5–5.1)
SODIUM: 141 mmol/L (ref 135–145)
TOTAL PROTEIN: 7 g/dL (ref 6.5–8.1)

## 2015-03-09 LAB — LIPASE, BLOOD: Lipase: 46 U/L (ref 11–51)

## 2015-03-09 LAB — TROPONIN I

## 2015-03-09 MED ORDER — MORPHINE SULFATE (PF) 4 MG/ML IV SOLN
INTRAVENOUS | Status: AC
  Administered 2015-03-09: 6 mg via INTRAMUSCULAR
  Filled 2015-03-09: qty 2

## 2015-03-09 MED ORDER — MORPHINE SULFATE (PF) 4 MG/ML IV SOLN
6.0000 mg | Freq: Once | INTRAVENOUS | Status: AC
  Administered 2015-03-09: 6 mg via INTRAMUSCULAR

## 2015-03-09 MED ORDER — ONDANSETRON 4 MG PO TBDP
4.0000 mg | ORAL_TABLET | Freq: Once | ORAL | Status: AC
  Administered 2015-03-09: 4 mg via ORAL

## 2015-03-09 MED ORDER — TRAMADOL HCL 50 MG PO TABS: 50.0000 mg | ORAL_TABLET | Freq: Four times a day (QID) | ORAL | Status: DC | PRN

## 2015-03-09 MED ORDER — ONDANSETRON 4 MG PO TBDP
ORAL_TABLET | ORAL | Status: AC
  Filled 2015-03-09: qty 1

## 2015-03-09 NOTE — Discharge Instructions (Signed)

## 2015-03-09 NOTE — ED Notes (Signed)
Pt presents with abdominal pain for three days with some nausea and vomiting.

## 2015-03-09 NOTE — ED Provider Notes (Addendum)
[facility] Emergency Department Provider Note  ____________________________________________   I have reviewed the triage vital signs and the nursing notes.   HISTORY  Chief Complaint Abdominal Pain    HPI Jessica Mathews is a 45 y.o. female presents today complaining of rib pain. She states she was leaning on the porch, and she put her feet up and she leaned very heavily on her chest and she felt that she may have cracked a rib by doing this. She states it is really been hurting. This happened 3 days ago. He states sometimes the pain seems so bad it makes her nauseated. She maybe even vomited once she states. The pain is worse when she touches it. There was a clear inciting incident with a sensation of a pop she states. She has had no abdominal paindenies shortness of breath, she denies cough or weakness. She denies headache or stiff neck. It is worse when she touches it and it is from leaning over the porch wall she states.  Past Medical History  Diagnosis Date  . Asthma     There are no active problems to display for this patient.   Past Surgical History  Procedure Laterality Date  . Abdominal hysterectomy      Current Outpatient Rx  Name  Route  Sig  Dispense  Refill  . diazepam (VALIUM) 5 MG tablet   Oral   Take 1 tablet (5 mg total) by mouth every 12 (twelve) hours as needed for muscle spasms.   10 tablet   0   . HYDROcodone-acetaminophen (NORCO/VICODIN) 5-325 MG per tablet   Oral   Take 1 tablet by mouth 2 (two) times daily as needed for severe pain.   6 tablet   0   . ibuprofen (ADVIL,MOTRIN) 800 MG tablet   Oral   Take 1 tablet (800 mg total) by mouth 3 (three) times daily.   21 tablet   0   . traMADol (ULTRAM) 50 MG tablet   Oral   Take 1 tablet (50 mg total) by mouth every 6 (six) hours as needed.   15 tablet   0     Allergies Zithromax  No family history on file.  Social History Social History  Substance Use Topics   . Smoking status: Current Every Day Smoker -- 0.50 packs/day    Types: Cigarettes  . Smokeless tobacco: None  . Alcohol Use: No    Review of Systems Constitutional: No fever/chills Eyes: No visual changes. ENT: No sore throat. No stiff neck no neck pain Cardiovascular: See history of present illness Respiratory: Denies shortness of breath. Gastrointestinal:   See history of present illness Genitourinary: Negative for dysuria. Musculoskeletal: Negative lower extremity swelling Skin: Negative for rash. Neurological: Negative for headaches, focal weakness or numbness. 10-point ROS otherwise negative.  ____________________________________________   PHYSICAL EXAM:  VITAL SIGNS: ED Triage Vitals  Enc Vitals Group     BP 03/09/15 1624 132/84 mmHg     Pulse Rate 03/09/15 1624 81     Resp 03/09/15 1624 20     Temp 03/09/15 1624 98.3 F (36.8 C)     Temp Source 03/09/15 1624 Oral     SpO2 03/09/15 1624 100 %     Weight 03/09/15 1624 172 lb (78.019 kg)     Height 03/09/15 1624  (1.651 m)     Head Cir --      Peak Flow --      Pain Score 03/09/15 1625 10  Pain Loc --      Pain Edu? --      Excl. in GC? --     Constitutional: Alert and oriented. Well appearing and in no acute distress. Anxious and upset Eyes: Conjunctivae are normal. PERRL. EOMI. Head: Atraumatic. Nose: No congestion/rhinnorhea. Mouth/Throat: Mucous membranes are moist.  Oropharynx non-erythematous. Neck: No stridor.   Nontender with no meningismus Cardiovascular: Normal rate, regular rhythm. Grossly normal heart sounds.  Good peripheral circulation. Chest: Tender to palpation along the ribs underneath the left breast. There is no abdominal pain underneath this area to suggest splenic injury. There is no crepitus there is no flail chest. Even light touch this area causes the patient does say "ouch that's the pain right there" and pulled back. Respiratory: Normal respiratory effort.  No retractions.  Lungs CTAB. Abdominal: Soft and nontender. No distention. No guarding no rebound Back:  There is no focal tenderness or step off there is no midline tenderness there are no lesions noted. there is no CVA tenderness Musculoskeletal: No lower extremity tenderness. No joint effusions, no DVT signs strong distal pulses no edema Neurologic:  Normal speech and language. No gross focal neurologic deficits are appreciated.  Skin:  Skin is warm, dry and intact. No rash noted. Psychiatric: Mood and affect are normal. Speech and behavior are normal.  ____________________________________________   LABS (all labs ordered are listed, but only abnormal results are displayed)  Labs Reviewed  COMPREHENSIVE METABOLIC PANEL - Abnormal; Notable for the following:    Glucose, Bld 102 (*)    All other components within normal limits  LIPASE, BLOOD  CBC  URINALYSIS COMPLETEWITH MICROSCOPIC ([facility] ONLY)  TROPONIN I   ____________________________________________  EKG  I personally interpreted any EKGs ordered by me or triage Normal sinus rhythm rate 69 bpm no acute elevation no acute ST depression normal axis unremarkable EKG ____________________________________________  RADIOLOGY  I reviewed any imaging ordered by me or triage that were performed during my shift ____________________________________________   PROCEDURES  Procedure(s) performed: None  Critical Care performed: None  ____________________________________________   INITIAL IMPRESSION / ASSESSMENT AND PLAN / ED COURSE  Pertinent labs & imaging results that were available during my care of the patient were reviewed by me and considered in my medical decision making (see chart for details).  Patient with very reproducible chest wall discomfort after leaning over a porch railing. No evidence of bruising or crepitus or flail chest. We will obtain a chest x-ray. At this time, there does not appear to be clinical evidence to support the  diagnosis of pulmonary embolus, dissection, myocarditis, endocarditis, pericarditis, pericardial tamponade, acute coronary syndrome, pneumothorax, pneumonia, or any other acute intrathoracic pathology that will require admission or acute intervention. Nor is there evidence of any significant intra-abdominal pathology causing this discomfort. Nor is there evidence of splenic injury. ____________________________________________   FINAL CLINICAL IMPRESSION(S) / ED DIAGNOSES  Final diagnoses:  Chest wall pain      This chart was dictated using voice recognition software.  Despite best efforts to proofread,  errors can occur which can change meaning.    Jeanmarie Plant, MD 03/09/15 1911  Jeanmarie Plant, MD 03/09/15 [phone]

## 2015-07-06 ENCOUNTER — Emergency Department: Payer: Self-pay

## 2015-07-06 ENCOUNTER — Emergency Department
Admission: EM | Admit: 2015-07-06 | Discharge: 2015-07-06 | Disposition: A | Discharge status: Home / Self Care | Payer: Self-pay | Attending: Emergency Medicine | Admitting: Emergency Medicine

## 2015-07-06 ENCOUNTER — Encounter: Payer: Self-pay | Admitting: Emergency Medicine

## 2015-07-06 DIAGNOSIS — R0789 Other chest pain: Secondary | ICD-10-CM | POA: Insufficient documentation

## 2015-07-06 DIAGNOSIS — Z79899 Other long term (current) drug therapy: Secondary | ICD-10-CM | POA: Insufficient documentation

## 2015-07-06 DIAGNOSIS — Z791 Long term (current) use of non-steroidal anti-inflammatories (NSAID): Secondary | ICD-10-CM | POA: Insufficient documentation

## 2015-07-06 DIAGNOSIS — F17210 Nicotine dependence, cigarettes, uncomplicated: Secondary | ICD-10-CM | POA: Insufficient documentation

## 2015-07-06 DIAGNOSIS — J45909 Unspecified asthma, uncomplicated: Secondary | ICD-10-CM | POA: Insufficient documentation

## 2015-07-06 MED ORDER — TRAMADOL HCL 50 MG PO TABS
50.0000 mg | ORAL_TABLET | Freq: Once | ORAL | Status: AC
  Administered 2015-07-06: 50 mg via ORAL

## 2015-07-06 MED ORDER — CYCLOBENZAPRINE HCL 10 MG PO TABS
5.0000 mg | ORAL_TABLET | Freq: Once | ORAL | Status: AC
  Administered 2015-07-06: 5 mg via ORAL
  Filled 2015-07-06: qty 1

## 2015-07-06 MED ORDER — TRAMADOL HCL 50 MG PO TABS: 50.0000 mg | ORAL_TABLET | Freq: Four times a day (QID) | ORAL | Status: DC | PRN

## 2015-07-06 MED ORDER — CYCLOBENZAPRINE HCL 5 MG PO TABS: 5.0000 mg | ORAL_TABLET | Freq: Three times a day (TID) | ORAL | Status: DC | PRN

## 2015-07-06 MED ORDER — TRAMADOL HCL 50 MG PO TABS
ORAL_TABLET | ORAL | Status: AC
  Administered 2015-07-06: 50 mg via ORAL
  Filled 2015-07-06: qty 1

## 2015-07-06 NOTE — ED Notes (Addendum)
Pt presents to ED from home via EMS for reports of right side rib pain. Pt states was helping brother move a refrigerator yesterday and heard a pop on the right side. Pt reports right side rib and shoulder pain since then. EMS reports 119/60, 96% 2L, 50 HR, cbg 150. EMS administered fentanyl 150 mg IV. Pt vomited once in triage. Pt denies nausea, pt states she thinks she vomited due to the pain.

## 2015-07-06 NOTE — Discharge Instructions (Signed)
Please seek medical attention for any high fevers, chest pain, shortness of breath, change in behavior, persistent vomiting, bloody stool or any other new or concerning symptoms. ° ° °Chest Wall Pain °Chest wall pain is pain in or around the bones and muscles of your chest. Sometimes, an injury causes this pain. Sometimes, the cause may not be known. This pain may take several weeks or longer to get better. °HOME CARE °Pay attention to any changes in your symptoms. Take these actions to help with your pain: °· Rest as told by your doctor. °· Avoid activities that cause pain. Try not to use your chest, belly (abdominal), or side muscles to lift heavy things. °· If directed, apply ice to the painful area: °¨ Put ice in a plastic bag. °¨ Place a towel between your skin and the bag. °¨ Leave the ice on for 20 minutes, 2-3 times per day. °· Take over-the-counter and prescription medicines only as told by your doctor. °· Do not use tobacco products, including cigarettes, chewing tobacco, and e-cigarettes. If you need help quitting, ask your doctor. °· Keep all follow-up visits as told by your doctor. This is important. °GET HELP IF: °· You have a fever. °· Your chest pain gets worse. °· You have new symptoms. °GET HELP RIGHT AWAY IF: °· You feel sick to your stomach (nauseous) or you throw up (vomit). °· You feel sweaty or light-headed. °· You have a cough with phlegm (sputum) or you cough up blood. °· You are short of breath. °  °This information is not intended to replace advice given to you by your health care provider. Make sure you discuss any questions you have with your health care provider. °  °Document Released: 06/27/2007 Document Revised: 09/29/2014 Document Reviewed: 04/05/2014 °Elsevier Interactive Patient Education ©2016 [employer] ° °

## 2015-07-06 NOTE — ED Provider Notes (Signed)
-----------------------------------------   4:11 PM on 07/06/2015 -----------------------------------------  Care was assumed by Dr. Derrill KayGoodman as approximately 3 PM pending CT scan of the chest. Briefly, this is a 45 year old female presenting with right-sided chest pain after carrying a heavy appliance yesterday and feeling a "pop" in her right chest. Chest x-ray was negative.  ----------------------------------------- 4:21 PM on 07/06/2015 -----------------------------------------  I reassessed the patient. She is sleeping but awakens to voice. she is still complaining of pain when she awakens but does appear quite comfortable. 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 Her vital signs are stable. We discussed return precautions, need for close PCP follow-up, and she is comfortable with the discharge plan. DC home.  ED ECG REPORT I, Gayla DossGayle, Nichalas Coin A, the attending physician, personally viewed and interpreted this ECG.   Date: 07/06/2015  EKG Time: 16:06  Rate: 45  Rhythm: sinus bradycardia  Axis: normal  Intervals:none  ST&T Change: No acute ST elevation. Chronic Q wave in V2, V3 and accounting for differences in lead placement, the EKG is unchanged from 03/09/2015.   CT chest IMPRESSION: No evidence of rib fracture.  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  Minimal left lateral basilar subsegmental atelectasis is noted.  Gayla DossEryka A Anwar Crill, MD 07/06/15 [phone]

## 2015-07-06 NOTE — ED Provider Notes (Signed)
[facility] Emergency Department Provider Note   ____________________________________________  Time seen: ~1400  I have reviewed the triage vital signs and the nursing notes.   HISTORY  Chief Complaint Rib Injury   History limited by: Not Limited   HPI Jessica Mathews is a 45 y.o. female resents to the emergency department today because of concerns for right sided chest pain. Pain is located just under her breast. It started yesterday. She was helping her brother move when she was carrying a heavy appliance. She felt a pop in her right breast. Since that time she has had pain there. It is worse with palpation. It is worse with movement. The patient states she had similar pain back in February however at that time was on the left side. No fevers.No recent travel. No history of blood clots or surgery.   Past Medical History  Diagnosis Date  . Asthma     There are no active problems to display for this patient.   Past Surgical History  Procedure Laterality Date  . Abdominal hysterectomy      Current Outpatient Rx  Name  Route  Sig  Dispense  Refill  . diazepam (VALIUM) 5 MG tablet   Oral   Take 1 tablet (5 mg total) by mouth every 12 (twelve) hours as needed for muscle spasms.   10 tablet   0   . HYDROcodone-acetaminophen (NORCO/VICODIN) 5-325 MG per tablet   Oral   Take 1 tablet by mouth 2 (two) times daily as needed for severe pain.   6 tablet   0   . ibuprofen (ADVIL,MOTRIN) 800 MG tablet   Oral   Take 1 tablet (800 mg total) by mouth 3 (three) times daily.   21 tablet   0   . traMADol (ULTRAM) 50 MG tablet   Oral   Take 1 tablet (50 mg total) by mouth every 6 (six) hours as needed.   15 tablet   0     Allergies Zithromax  No family history on file.  Social History Social History  Substance Use Topics  . Smoking status: Current Every Day Smoker -- 0.50 packs/day    Types: Cigarettes  . Smokeless tobacco: None  . Alcohol  Use: No    Review of Systems  Constitutional: Negative for fever. Cardiovascular: Positive for right-sided chest pain Respiratory: Negative for shortness of breath. Gastrointestinal: Negative for abdominal pain, vomiting and diarrhea. Neurological: Negative for headaches, focal weakness or numbness.  10-point ROS otherwise negative.  ____________________________________________   PHYSICAL EXAM:  VITAL SIGNS: ED Triage Vitals  Enc Vitals Group     BP 07/06/15 1028 134/64 mmHg     Pulse Rate 07/06/15 1028 54     Resp 07/06/15 1028 18     Temp 07/06/15 1028 98.1 F (36.7 C)     Temp Source 07/06/15 1028 Oral     SpO2 07/06/15 1028 96 %     Weight 07/06/15 1028 172 lb (78.019 kg)     Height 07/06/15 1028 5\' 5"  (1.651 m)     Head Cir --      Peak Flow --      Pain Score 07/06/15 1029 10   Constitutional: Alert and oriented. Well appearing and in no distress Upon initial assessment however during the exam patient started to have discomfort. Eyes: Conjunctivae are normal. PERRL. Normal extraocular movements. ENT   Head: Normocephalic and atraumatic.   Nose: No congestion/rhinnorhea.   Mouth/Throat: Mucous membranes are moist.  Neck: No stridor. Hematological/Lymphatic/Immunilogical: No cervical lymphadenopathy. Cardiovascular: Normal rate, regular rhythm.  No murmurs, rubs, or gallops. Tender to palpation on the right thorax just below the breast. No crepitus. Respiratory: Normal respiratory effort without tachypnea nor retractions. Breath sounds are clear and equal bilaterally. No wheezes/rales/rhonchi. Gastrointestinal: Soft and nontender. No distention. There is no CVA tenderness. Genitourinary: Deferred Musculoskeletal: Normal range of motion in all extremities. No joint effusions.  No lower extremity tenderness nor edema. Neurologic:  Normal speech and language. No gross focal neurologic deficits are appreciated.  Skin:  Skin is warm, dry and intact. No  rash noted. Psychiatric: Mood and affect are normal. Speech and behavior are normal. Patient exhibits appropriate insight and judgment.  ____________________________________________    LABS (pertinent positives/negatives)  None  ____________________________________________   EKG  None  ____________________________________________    RADIOLOGY  Rib series IMPRESSION: No evidence of acute right-sided rib fracture, pleural effusion or pneumothorax.  ____________________________________________   PROCEDURES  Procedure(s) performed: None  Critical Care performed: No  ____________________________________________   INITIAL IMPRESSION / ASSESSMENT AND PLAN / ED COURSE  Pertinent labs & imaging results that were available during my care of the patient were reviewed by me and considered in my medical decision making (see chart for details).  Patient presented to the emergency department today because of concerns for right-sided chest pain that started after helping her brother move. It series was negative. Patient however complaining of significant pain. Will check CT scan. This point I doubt pneumonia, pneumothorax. Doubt pulmonary embolism. Doubt ACS given location of pain and eliciting factors.  ____________________________________________   FINAL CLINICAL IMPRESSION(S) / ED DIAGNOSES  Final diagnoses:  Other chest pain     Note: This dictation was prepared with Dragon dictation. Any transcriptional errors that result from this process are unintentional    Phineas Semen, MD 07/06/15 1515

## 2015-08-31 IMAGING — CR RIGHT FOOT COMPLETE - 3+ VIEW
1 series · 3 of 3 positions shown · non-contrast
Comparison: None.

CLINICAL DATA: Initial encounter for jar falling on foot today with
pain swelling and redness in the region of the great toe.

EXAM:
RIGHT FOOT COMPLETE - 3+ VIEW

[Series 1: ap · 0.17mm/px · 3 of 3 slices shown]
[im 1/3]
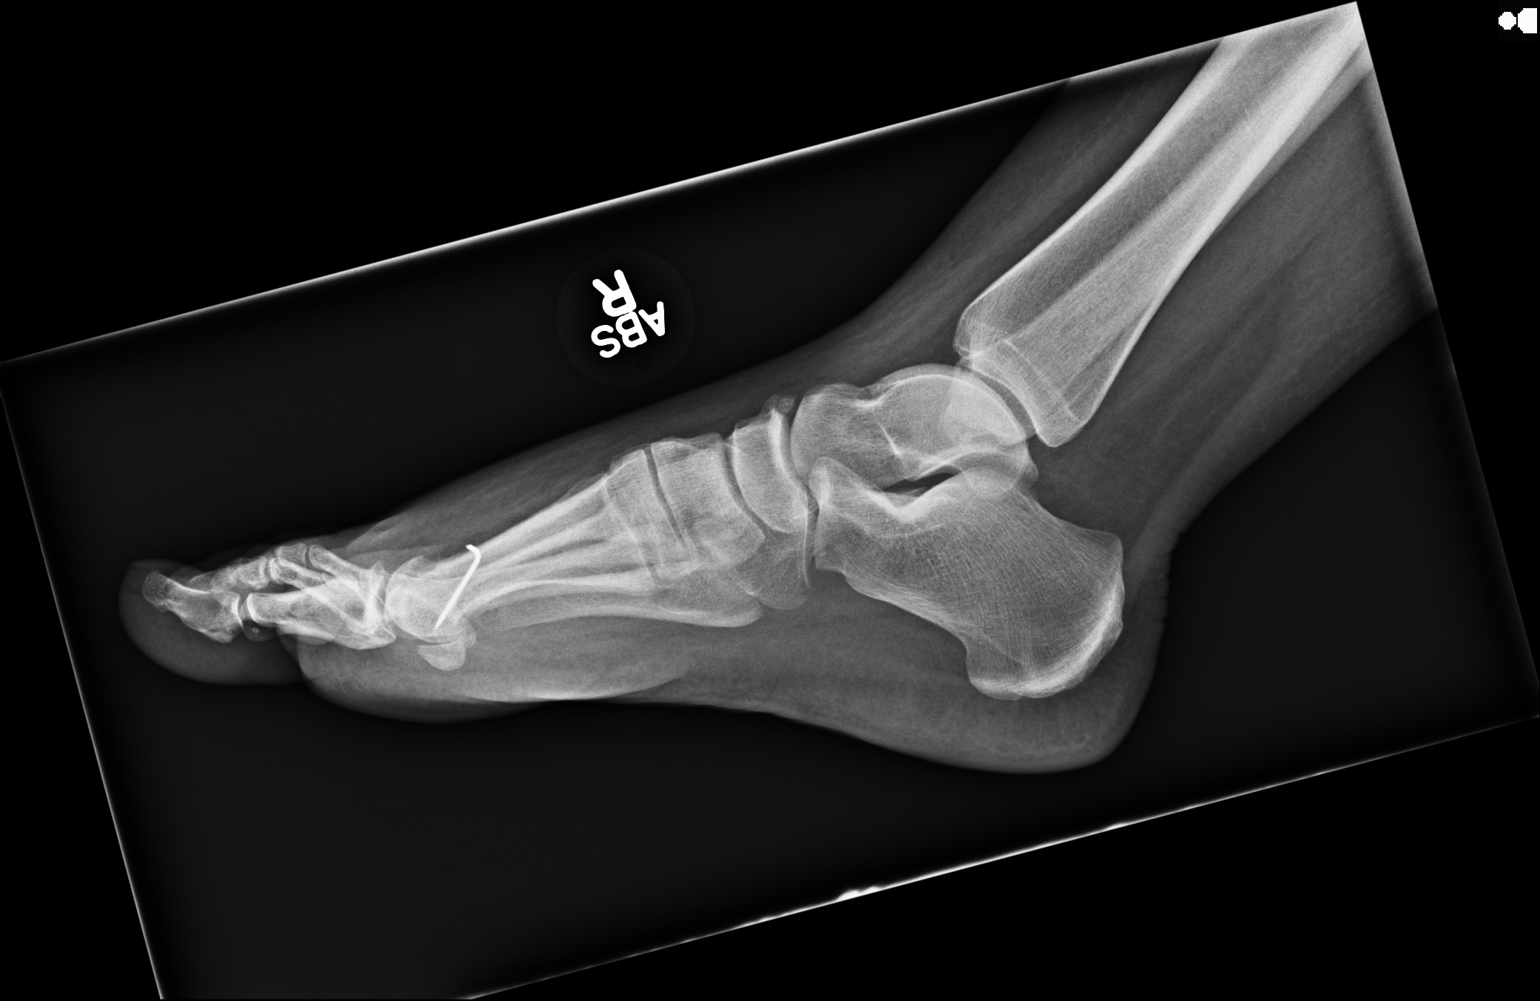
[im 2/3]
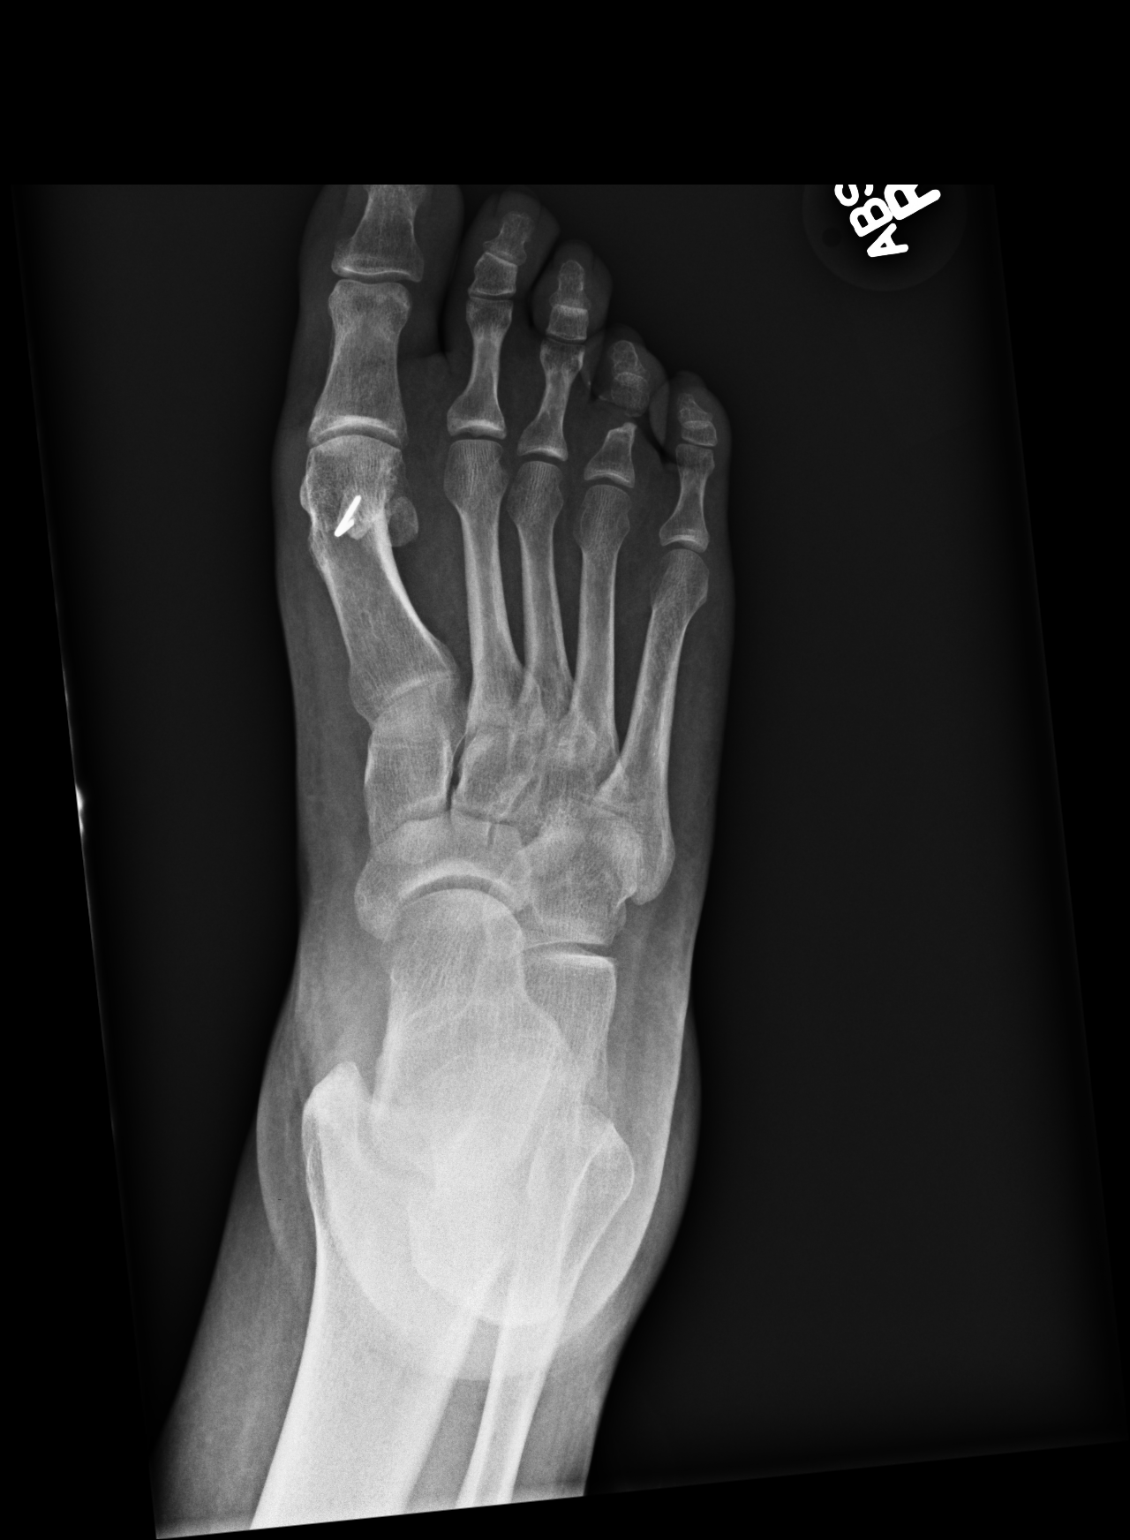
[im 3/3]
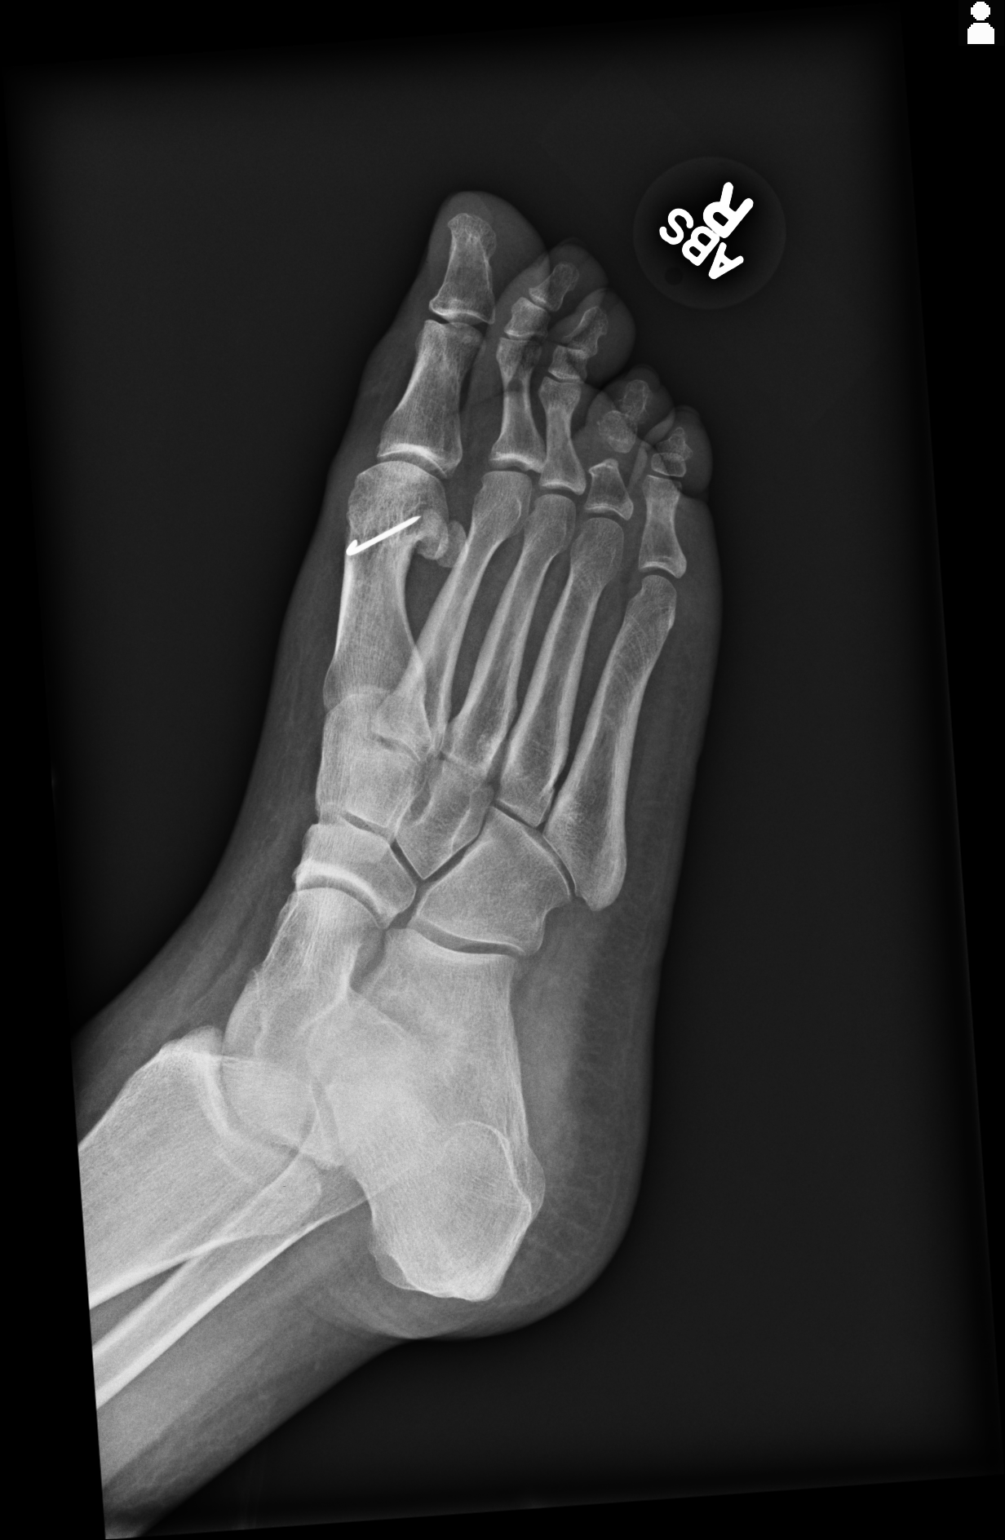

[3 of 3 positions shown; findings below may reference images not displayed]

FINDINGS: Three views study shows no evidence for an acute fracture.
Postsurgical changes seen in the first metatarsal. There is been or
section of the proximal phalanx heel head of the fourth toe. No
worrisome lytic or sclerotic abnormality. Overlying soft tissues are
unremarkable.
IMPRESSION: No acute bony findings.

## 2015-10-05 ENCOUNTER — Emergency Department
Admission: EM | Admit: 2015-10-05 | Discharge: 2015-10-05 | Disposition: A | Discharge status: Home / Self Care | Payer: Self-pay | Attending: Emergency Medicine | Admitting: Emergency Medicine

## 2015-10-05 ENCOUNTER — Encounter: Payer: Self-pay | Admitting: Emergency Medicine

## 2015-10-05 DIAGNOSIS — Z4802 Encounter for removal of sutures: Secondary | ICD-10-CM | POA: Insufficient documentation

## 2015-10-05 DIAGNOSIS — R51 Headache: Secondary | ICD-10-CM | POA: Insufficient documentation

## 2015-10-05 DIAGNOSIS — J45909 Unspecified asthma, uncomplicated: Secondary | ICD-10-CM | POA: Insufficient documentation

## 2015-10-05 DIAGNOSIS — Z791 Long term (current) use of non-steroidal anti-inflammatories (NSAID): Secondary | ICD-10-CM | POA: Insufficient documentation

## 2015-10-05 DIAGNOSIS — F17210 Nicotine dependence, cigarettes, uncomplicated: Secondary | ICD-10-CM | POA: Insufficient documentation

## 2015-10-05 MED ORDER — BUTALBITAL-APAP-CAFFEINE 50-325-40 MG PO TABS: 1.0000 | ORAL_TABLET | Freq: Four times a day (QID) | ORAL | 0 refills | Status: DC | PRN

## 2015-10-05 NOTE — ED Notes (Signed)
suturres removed from forehead without diff.  Steri strips applied.    D/c inst to pt.   Tolerated well.

## 2015-10-05 NOTE — ED Triage Notes (Signed)
Suture removal to forehead

## 2015-10-05 NOTE — ED Provider Notes (Signed)
[facility] Emergency Department Provider Note  ____________________________________________  Time seen: Approximately 3:08 PM  I have reviewed the triage vital signs and the nursing notes.   HISTORY  Chief Complaint Suture / Staple Removal    HPI Jessica Mathews is a 45 y.o. female presents for suture removal. Patient states that she had stepped and stitches placed her head one week ago and complains of occasional headaches.   Past Medical History:  Diagnosis Date  . Asthma     There are no active problems to display for this patient.   Past Surgical History:  Procedure Laterality Date  . ABDOMINAL HYSTERECTOMY      Prior to Admission medications   Medication Sig Start Date End Date Taking? Authorizing Provider  butalbital-acetaminophen-caffeine (FIORICET, ESGIC) [phone] MG tablet Take 1-2 tablets by mouth every 6 (six) hours as needed for headache. 10/05/15   Evangeline Dakinharles M Beers, PA-C  cyclobenzaprine (FLEXERIL) 5 MG tablet Take 1 tablet (5 mg total) by mouth every 8 (eight) hours as needed for muscle spasms. 07/06/15   Phineas SemenGraydon Goodman, MD  diazepam (VALIUM) 5 MG tablet Take 1 tablet (5 mg total) by mouth every 12 (twelve) hours as needed for muscle spasms. 10/30/14   Hannah Muthersbaugh, PA-C  HYDROcodone-acetaminophen (NORCO/VICODIN) 5-325 MG per tablet Take 1 tablet by mouth 2 (two) times daily as needed for severe pain. 10/01/14   Tomasita CrumbleAdeleke Oni, MD  ibuprofen (ADVIL,MOTRIN) 800 MG tablet Take 1 tablet (800 mg total) by mouth 3 (three) times daily. 10/30/14   Hannah Muthersbaugh, PA-C    Allergies Zithromax [azithromycin]  No family history on file.  Social History Social History  Substance Use Topics  . Smoking status: Current Every Day Smoker    Packs/day: 0.50    Types: Cigarettes  . Smokeless tobacco: Never Used  . Alcohol use No    Review of Systems Constitutional: No fever/chills Eyes: No visual changes. ENT: No sore throat. Skin:  Healed laceration. Neurological: Negative for headaches, focal weakness or numbness.  10-point ROS otherwise negative.  ____________________________________________   PHYSICAL EXAM:  VITAL SIGNS: ED Triage Vitals [10/05/15 1501]  Enc Vitals Group     BP (!) 144/83     Pulse Rate 81     Resp 20     Temp 98.7 F (37.1 C)     Temp Source Oral     SpO2 99 %     Weight 172 lb (78 kg)     Height 5\' 6"  (1.676 m)     Head Circumference      Peak Flow      Pain Score      Pain Loc      Pain Edu?      Excl. in GC?     Constitutional: Alert and oriented. Well appearing and in no acute distress. Eyes: Conjunctivae are normal. PERRL. EOMI. Head:Post suture scarring noted to the anterior forehead. Skin:  Skin is warm, dry and intact. No rash noted. 7 sutures intact. Psychiatric: Mood and affect are normal. Speech and behavior are normal.  ____________________________________________   LABS (all labs ordered are listed, but only abnormal results are displayed)  Labs Reviewed - No data to display ____________________________________________  EKG   ____________________________________________  RADIOLOGY   ____________________________________________   PROCEDURES  Procedure(s) performed: None  Critical Care performed: No  ____________________________________________   INITIAL IMPRESSION / ASSESSMENT AND PLAN / ED COURSE  Pertinent labs & imaging results that were available during my care of the  patient were reviewed by me and considered in my medical decision making (see chart for details). Review of the [place] CSRS was performed in accordance of the [facility] prior to dispensing any controlled drugs.  Suture removal. Rx given for Fioricet for occasional headaches.  Return ER as needed  Clinical Course    ____________________________________________   FINAL CLINICAL IMPRESSION(S) / ED DIAGNOSES  Final diagnoses:  Visit for suture removal     This chart was  dictated using voice recognition software/Dragon. Despite best efforts to proofread, errors can occur which can change the meaning. Any change was purely unintentional.    Evangeline Dakin, PA-C 10/05/15 1519    Minna Antis, MD 10/05/15 1550

## 2016-02-28 ENCOUNTER — Encounter: Payer: Self-pay | Admitting: Emergency Medicine

## 2016-02-28 DIAGNOSIS — R110 Nausea: Secondary | ICD-10-CM | POA: Insufficient documentation

## 2016-02-28 DIAGNOSIS — J45909 Unspecified asthma, uncomplicated: Secondary | ICD-10-CM | POA: Insufficient documentation

## 2016-02-28 DIAGNOSIS — Z79899 Other long term (current) drug therapy: Secondary | ICD-10-CM | POA: Insufficient documentation

## 2016-02-28 DIAGNOSIS — R0981 Nasal congestion: Secondary | ICD-10-CM | POA: Insufficient documentation

## 2016-02-28 DIAGNOSIS — F17210 Nicotine dependence, cigarettes, uncomplicated: Secondary | ICD-10-CM | POA: Insufficient documentation

## 2016-02-28 DIAGNOSIS — R509 Fever, unspecified: Secondary | ICD-10-CM | POA: Insufficient documentation

## 2016-02-28 DIAGNOSIS — R05 Cough: Secondary | ICD-10-CM | POA: Insufficient documentation

## 2016-02-28 NOTE — ED Triage Notes (Signed)
Pt presents to ED with c/o productive cough yellow colored, chills, fever, and generalized body aches x 1 day. Pt reports taking OTC medicine without relief. Pt afebrile in triage. Pt refused to wear mask, states it makes her feel anxious.

## 2016-02-29 ENCOUNTER — Emergency Department
Admission: EM | Admit: 2016-02-29 | Discharge: 2016-02-29 | Disposition: A | Discharge status: Home / Self Care | Payer: Self-pay | Attending: Emergency Medicine | Admitting: Emergency Medicine

## 2016-02-29 DIAGNOSIS — R6889 Other general symptoms and signs: Secondary | ICD-10-CM

## 2016-02-29 MED ORDER — HYDROCODONE-ACETAMINOPHEN 5-325 MG PO TABS
1.0000 | ORAL_TABLET | Freq: Once | ORAL | Status: AC
  Administered 2016-02-29: 1 via ORAL
  Filled 2016-02-29: qty 1

## 2016-02-29 MED ORDER — HYDROCOD POLST-CPM POLST ER 10-8 MG/5ML PO SUER: 5.0000 mL | Freq: Two times a day (BID) | ORAL | 0 refills | Status: DC

## 2016-02-29 MED ORDER — OSELTAMIVIR PHOSPHATE 75 MG PO CAPS: 75.0000 mg | ORAL_CAPSULE | Freq: Two times a day (BID) | ORAL | 0 refills | Status: DC

## 2016-02-29 NOTE — Discharge Instructions (Signed)
1. Take Tamiflu as prescribed. 2. You may take cough medicine as needed (Tussionex). 3. Return to the ER for worsening symptoms, persistent vomiting, difficulty breathing or other concerns.

## 2016-02-29 NOTE — ED Provider Notes (Signed)
[facility] Emergency Department Provider Note   ____________________________________________   First MD Initiated Contact with Patient 02/29/16 [phone]     (approximate)  I have reviewed the triage vital signs and the nursing notes.   HISTORY  Chief Complaint Generalized Body Aches    HPI Jessica Mathews is a 46 y.o. female who presents to the ED from home with chief complaint of flulike symptoms. Patient reports a one-day history of fever, body aches, congestion, nonproductive cough and nausea. Denies chest pain, shortness of breath, abdominal pain, vomiting, dysuria, diarrhea. Denies recent travel or trauma. + sick contacts.   Past Medical History:  Diagnosis Date  . Asthma     There are no active problems to display for this patient.   Past Surgical History:  Procedure Laterality Date  . ABDOMINAL HYSTERECTOMY      Prior to Admission medications   Medication Sig Start Date End Date Taking? Authorizing Provider  butalbital-acetaminophen-caffeine (FIORICET, ESGIC) [phone] MG tablet Take 1-2 tablets by mouth every 6 (six) hours as needed for headache. 10/05/15   Evangeline Dakin, PA-C  chlorpheniramine-HYDROcodone (TUSSIONEX PENNKINETIC ER) 10-8 MG/5ML SUER Take 5 mLs by mouth 2 (two) times daily. 02/29/16   Irean Hong, MD  cyclobenzaprine (FLEXERIL) 5 MG tablet Take 1 tablet (5 mg total) by mouth every 8 (eight) hours as needed for muscle spasms. 07/06/15   Phineas Semen, MD  diazepam (VALIUM) 5 MG tablet Take 1 tablet (5 mg total) by mouth every 12 (twelve) hours as needed for muscle spasms. 10/30/14   Hannah Muthersbaugh, PA-C  HYDROcodone-acetaminophen (NORCO/VICODIN) 5-325 MG per tablet Take 1 tablet by mouth 2 (two) times daily as needed for severe pain. 10/01/14   Tomasita Crumble, MD  ibuprofen (ADVIL,MOTRIN) 800 MG tablet Take 1 tablet (800 mg total) by mouth 3 (three) times daily. 10/30/14   Hannah Muthersbaugh, PA-C  oseltamivir (TAMIFLU) 75 MG  capsule Take 1 capsule (75 mg total) by mouth 2 (two) times daily. 02/29/16   Irean Hong, MD    Allergies Zithromax [azithromycin]  No family history on file.  Social History Social History  Substance Use Topics  . Smoking status: Current Every Day Smoker    Packs/day: 0.50    Types: Cigarettes  . Smokeless tobacco: Never Used  . Alcohol use No    Review of Systems  Constitutional: Positive for body aches, fever/chills. Eyes: No visual changes. ENT: Positive for nasal congestion. No sore throat. Cardiovascular: Denies chest pain. Respiratory: Positive for nonproductive cough. Denies shortness of breath. Gastrointestinal: No abdominal pain.  Positive for nausea, no vomiting.  No diarrhea.  No constipation. Genitourinary: Negative for dysuria. Musculoskeletal: Negative for back pain. Skin: Negative for rash. Neurological: Negative for headaches, focal weakness or numbness.  10-point ROS otherwise negative.  ____________________________________________   PHYSICAL EXAM:  VITAL SIGNS: ED Triage Vitals  Enc Vitals Group     BP 02/28/16 2306 121/79     Pulse Rate 02/28/16 2306 75     Resp 02/28/16 2306 (!) 22     Temp 02/28/16 2306 98.2 F (36.8 C)     Temp Source 02/28/16 2306 Oral     SpO2 02/28/16 2306 100 %     Weight 02/28/16 2308 168 lb (76.2 kg)     Height 02/28/16 2308 5\' 5"  (1.651 m)     Head Circumference --      Peak Flow --      Pain Score 02/28/16 2311 9  Pain Loc --      Pain Edu? --      Excl. in GC? --     Constitutional: Alert and oriented. Well appearing and in no acute distress. Eyes: Conjunctivae are normal. PERRL. EOMI. Head: Atraumatic. Nose: Congestion/rhinnorhea. Mouth/Throat: Mucous membranes are moist.  Oropharynx non-erythematous. Neck: No stridor.  Supple neck without meningismus. Hematological/Lymphatic/Immunilogical: No cervical lymphadenopathy. Cardiovascular: Normal rate, regular rhythm. Grossly normal heart sounds.  Good  peripheral circulation. Respiratory: Normal respiratory effort.  No retractions. Lungs CTAB. Gastrointestinal: Soft and nontender. No distention. No abdominal bruits. No CVA tenderness. Musculoskeletal: No lower extremity tenderness nor edema.  No joint effusions. Neurologic:  Normal speech and language. No gross focal neurologic deficits are appreciated. No gait instability. Skin:  Skin is warm, dry and intact. No rash noted. No petechiae. Psychiatric: Mood and affect are normal. Speech and behavior are normal.  ____________________________________________   LABS (all labs ordered are listed, but only abnormal results are displayed)  Labs Reviewed - No data to display ____________________________________________  EKG  None ____________________________________________  RADIOLOGY  None ____________________________________________   PROCEDURES  Procedure(s) performed: None  Procedures  Critical Care performed: No  ____________________________________________   INITIAL IMPRESSION / ASSESSMENT AND PLAN / ED COURSE  Pertinent labs & imaging results that were available during my care of the patient were reviewed by me and considered in my medical decision making (see chart for details).  46 year old female who presents with influenza-like symptoms. Discussed risks/benefits of Tamiflu and patient would like a prescription. Will also prescribe Tussionex to take as needed for cough. Strict return precautions given. Patient verbalizes understanding and agrees with plan of care.      ____________________________________________   FINAL CLINICAL IMPRESSION(S) / ED DIAGNOSES  Final diagnoses:  Flu-like symptoms      NEW MEDICATIONS STARTED DURING THIS VISIT:  New Prescriptions   CHLORPHENIRAMINE-HYDROCODONE (TUSSIONEX PENNKINETIC ER) 10-8 MG/5ML SUER    Take 5 mLs by mouth 2 (two) times daily.   OSELTAMIVIR (TAMIFLU) 75 MG CAPSULE    Take 1 capsule (75 mg total) by  mouth 2 (two) times daily.     Note:  This document was prepared using Dragon voice recognition software and may include unintentional dictation errors.    Irean HongJade J Yasamin Karel, MD 02/29/16 [phone]

## 2016-02-29 NOTE — ED Notes (Signed)
See triage note. Patient reports highest fever of 102.1. Patient states she has had body aches, cough (productive previously, but not currently), nasal congestion and nasal drainage. Patient denies headache.

## 2016-10-10 ENCOUNTER — Emergency Department
Admission: EM | Admit: 2016-10-10 | Discharge: 2016-10-10 | Disposition: A | Discharge status: Home / Self Care | Payer: Self-pay | Attending: Emergency Medicine | Admitting: Emergency Medicine

## 2016-10-10 ENCOUNTER — Encounter: Payer: Self-pay | Admitting: Emergency Medicine

## 2016-10-10 DIAGNOSIS — Z79899 Other long term (current) drug therapy: Secondary | ICD-10-CM | POA: Insufficient documentation

## 2016-10-10 DIAGNOSIS — J0110 Acute frontal sinusitis, unspecified: Secondary | ICD-10-CM | POA: Insufficient documentation

## 2016-10-10 DIAGNOSIS — J45909 Unspecified asthma, uncomplicated: Secondary | ICD-10-CM | POA: Insufficient documentation

## 2016-10-10 DIAGNOSIS — F17210 Nicotine dependence, cigarettes, uncomplicated: Secondary | ICD-10-CM | POA: Insufficient documentation

## 2016-10-10 DIAGNOSIS — H9201 Otalgia, right ear: Secondary | ICD-10-CM | POA: Insufficient documentation

## 2016-10-10 MED ORDER — IBUPROFEN 800 MG PO TABS
ORAL_TABLET | ORAL | Status: AC
  Administered 2016-10-10: 800 mg via ORAL
  Filled 2016-10-10: qty 1

## 2016-10-10 MED ORDER — AMOXICILLIN-POT CLAVULANATE 875-125 MG PO TABS: 1.0000 | ORAL_TABLET | Freq: Two times a day (BID) | ORAL | 0 refills | Status: AC

## 2016-10-10 MED ORDER — PREDNISONE 10 MG PO TABS: ORAL_TABLET | ORAL | 0 refills | Status: DC

## 2016-10-10 MED ORDER — IBUPROFEN 800 MG PO TABS
800.0000 mg | ORAL_TABLET | Freq: Once | ORAL | Status: AC
  Administered 2016-10-10: 800 mg via ORAL

## 2016-10-10 MED ORDER — IBUPROFEN 800 MG PO TABS: 800.0000 mg | ORAL_TABLET | Freq: Three times a day (TID) | ORAL | 0 refills | Status: DC | PRN

## 2016-10-10 MED ORDER — AMOXICILLIN-POT CLAVULANATE 875-125 MG PO TABS
1.0000 | ORAL_TABLET | Freq: Once | ORAL | Status: AC
  Administered 2016-10-10: 1 via ORAL
  Filled 2016-10-10: qty 1

## 2016-10-10 MED ORDER — OXYCODONE-ACETAMINOPHEN 5-325 MG PO TABS
1.0000 | ORAL_TABLET | Freq: Once | ORAL | Status: AC
  Administered 2016-10-10: 1 via ORAL
  Filled 2016-10-10: qty 1

## 2016-10-10 MED ORDER — DEXAMETHASONE SODIUM PHOSPHATE 10 MG/ML IJ SOLN
10.0000 mg | Freq: Once | INTRAMUSCULAR | Status: AC
  Administered 2016-10-10: 10 mg via INTRAMUSCULAR
  Filled 2016-10-10: qty 1

## 2016-10-10 NOTE — ED Provider Notes (Signed)
[facility] Emergency Department Provider Note  ____________________________________________  Time seen: Approximately 10:09 PM  I have reviewed the triage vital signs and the nursing notes.   HISTORY  Chief Complaint Otalgia    HPI Jessica Mathews is a 46 y.o. female that presents to emergency department for evaluation of right ear pain for 1 day and nasal congestion for 2 days.Patient states that he feels like there is a lot of pressure in it. She has not checked her temperature. No headache, shortness breath, chest pain, nausea, vomiting, abdominal pain.   Past Medical History:  Diagnosis Date  . Asthma     There are no active problems to display for this patient.   Past Surgical History:  Procedure Laterality Date  . ABDOMINAL HYSTERECTOMY      Prior to Admission medications   Medication Sig Start Date End Date Taking? Authorizing Provider  amoxicillin-clavulanate (AUGMENTIN) 875-125 MG tablet Take 1 tablet by mouth 2 (two) times daily. 10/10/16 10/20/16  Enid Derry, PA-C  butalbital-acetaminophen-caffeine (FIORICET, ESGIC) [phone] MG tablet Take 1-2 tablets by mouth every 6 (six) hours as needed for headache. 10/05/15   Beers, Charmayne Sheer, PA-C  chlorpheniramine-HYDROcodone (TUSSIONEX PENNKINETIC ER) 10-8 MG/5ML SUER Take 5 mLs by mouth 2 (two) times daily. 02/29/16   Irean Hong, MD  cyclobenzaprine (FLEXERIL) 5 MG tablet Take 1 tablet (5 mg total) by mouth every 8 (eight) hours as needed for muscle spasms. 07/06/15   Phineas Semen, MD  diazepam (VALIUM) 5 MG tablet Take 1 tablet (5 mg total) by mouth every 12 (twelve) hours as needed for muscle spasms. 10/30/14   Muthersbaugh, Dahlia Client, PA-C  HYDROcodone-acetaminophen (NORCO/VICODIN) 5-325 MG per tablet Take 1 tablet by mouth 2 (two) times daily as needed for severe pain. 10/01/14   Tomasita Crumble, MD  ibuprofen (ADVIL,MOTRIN) 800 MG tablet Take 1 tablet (800 mg total) by mouth every 8 (eight) hours as  needed. 10/10/16   Enid Derry, PA-C  oseltamivir (TAMIFLU) 75 MG capsule Take 1 capsule (75 mg total) by mouth 2 (two) times daily. 02/29/16   Irean Hong, MD  predniSONE (DELTASONE) 10 MG tablet Take 6 tablets on day 1, take 5 tablets on day 2, take 4 tablets on day 3, take 3 tablets on day 4, take 2 tablets on day 5, take 1 tablet on day 6 10/10/16   Enid Derry, PA-C    Allergies Zithromax [azithromycin]  No family history on file.  Social History Social History  Substance Use Topics  . Smoking status: Current Every Day Smoker    Packs/day: 0.50    Types: Cigarettes  . Smokeless tobacco: Never Used  . Alcohol use No     Review of Systems  Constitutional: No fever/chills Eyes: No visual changes. No discharge. ENT: Positive for congestion and rhinorrhea. Cardiovascular: No chest pain. Respiratory: Positive for dry cough. No SOB. Gastrointestinal: No abdominal pain.  No nausea, no vomiting.  No diarrhea.  No constipation. Musculoskeletal: Negative for musculoskeletal pain. Skin: Negative for rash, abrasions, lacerations, ecchymosis. Neurological: Negative for headaches.   ____________________________________________   PHYSICAL EXAM:  VITAL SIGNS: ED Triage Vitals [10/10/16 2051]  Enc Vitals Group     BP 126/77     Pulse Rate 80     Resp 20     Temp 99.4 F (37.4 C)     Temp Source Oral     SpO2 100 %     Weight 172 lb (78 kg)  Height  (1.651 m)     Head Circumference      Peak Flow      Pain Score 10     Pain Loc      Pain Edu?      Excl. in GC?      Constitutional: Alert and oriented. Well appearing and in no acute distress. Eyes: Conjunctivae are normal. PERRL. EOMI. No discharge. Head: Atraumatic. ENT: Tenderness to palpation over right frontal sinus.      Ears: Tympanic membranes pearly gray with good landmarks. No discharge. Extreme tenderness to palpation with palpation of tragus and pinna.      Nose: Mild congestion/rhinnorhea.       Mouth/Throat: Mucous membranes are moist. Oropharynx non-erythematous.  Neck: No stridor.   Hematological/Lymphatic/Immunilogical: No cervical lymphadenopathy. Cardiovascular: Normal rate, regular rhythm.  Good peripheral circulation. Respiratory: Normal respiratory effort without tachypnea or retractions. Lungs CTAB. Good air entry to the bases with no decreased or absent breath sounds. Gastrointestinal: Bowel sounds 4 quadrants. Soft and nontender to palpation. No guarding or rigidity. No palpable masses. No distention. Musculoskeletal: Full range of motion to all extremities. No gross deformities appreciated. Neurologic:  Normal speech and language. No gross focal neurologic deficits are appreciated.  Skin:  Skin is warm, dry and intact. No rash noted.   ____________________________________________   LABS (all labs ordered are listed, but only abnormal results are displayed)  Labs Reviewed - No data to display ____________________________________________  EKG   ____________________________________________  RADIOLOGY  No results found.  ____________________________________________    PROCEDURES  Procedure(s) performed:    Procedures    Medications  oxyCODONE-acetaminophen (PERCOCET/ROXICET) 5-325 MG per tablet 1 tablet (1 tablet Oral Given 10/10/16 2214)  dexamethasone (DECADRON) injection 10 mg (10 mg Intramuscular Given 10/10/16 2215)  amoxicillin-clavulanate (AUGMENTIN) 875-125 MG per tablet 1 tablet (1 tablet Oral Given 10/10/16 2214)  ibuprofen (ADVIL,MOTRIN) tablet 800 mg (800 mg Oral Given 10/10/16 2306)     ____________________________________________   INITIAL IMPRESSION / ASSESSMENT AND PLAN / ED COURSE  Pertinent labs & imaging results that were available during my care of the patient were reviewed by me and considered in my medical decision making (see chart for details).  Review of the [place] CSRS was performed in accordance of the [facility] prior to  dispensing any controlled drugs.  Patient's diagnosis is consistent with sinusitis. Vital signs and exam are reassuring. Patient was exteremly upset in the ED so she was given a percocet. Patient feels comfortable going home. Patient will be discharged home with prescriptions for Augmentin, ibuprofen and prednisone. Patient is to follow up with PCP as needed or otherwise directed. Patient is given ED precautions to return to the ED for any worsening or new symptoms.     ____________________________________________  FINAL CLINICAL IMPRESSION(S) / ED DIAGNOSES  Final diagnoses:  Otalgia of right ear  Acute non-recurrent frontal sinusitis      NEW MEDICATIONS STARTED DURING THIS VISIT:  Discharge Medication List as of 10/10/2016 11:00 PM    START taking these medications   Details  amoxicillin-clavulanate (AUGMENTIN) 875-125 MG tablet Take 1 tablet by mouth 2 (two) times daily., Starting Wed 10/10/2016, Until Sat 10/20/2016, Print    predniSONE (DELTASONE) 10 MG tablet Take 6 tablets on day 1, take 5 tablets on day 2, take 4 tablets on day 3, take 3 tablets on day 4, take 2 tablets on day 5, take 1 tablet on day 6, Print  This chart was dictated using voice recognition software/Dragon. Despite best efforts to proofread, errors can occur which can change the meaning. Any change was purely unintentional.    Enid Derry, PA-C 10/10/16 2350    Jeanmarie Plant, MD 10/12/16 [phone]

## 2016-10-10 NOTE — ED Triage Notes (Signed)
Pt presents to ED with right ear pain for the past hour. Reports nasal congestion the past couple of days and dry cough.

## 2017-03-03 ENCOUNTER — Emergency Department
Admission: EM | Admit: 2017-03-03 | Discharge: 2017-03-04 | Disposition: A | Discharge status: Home / Self Care | Payer: Self-pay | Attending: Emergency Medicine | Admitting: Emergency Medicine

## 2017-03-03 ENCOUNTER — Other Ambulatory Visit: Payer: Self-pay

## 2017-03-03 DIAGNOSIS — R05 Cough: Secondary | ICD-10-CM | POA: Insufficient documentation

## 2017-03-03 DIAGNOSIS — F17210 Nicotine dependence, cigarettes, uncomplicated: Secondary | ICD-10-CM | POA: Insufficient documentation

## 2017-03-03 DIAGNOSIS — J45909 Unspecified asthma, uncomplicated: Secondary | ICD-10-CM | POA: Insufficient documentation

## 2017-03-03 DIAGNOSIS — R109 Unspecified abdominal pain: Secondary | ICD-10-CM

## 2017-03-03 DIAGNOSIS — K5730 Diverticulosis of large intestine without perforation or abscess without bleeding: Secondary | ICD-10-CM | POA: Insufficient documentation

## 2017-03-03 LAB — CBC
HCT: 40.5 % (ref 35.0–47.0)
HEMOGLOBIN: 13.8 g/dL (ref 12.0–16.0)
MCH: 31.6 pg (ref 26.0–34.0)
MCHC: 33.9 g/dL (ref 32.0–36.0)
MCV: 93.1 fL (ref 80.0–100.0)
PLATELETS: 195 10*3/uL (ref 150–440)
RBC: 4.35 MIL/uL (ref 3.80–5.20)
RDW: 12.6 % (ref 11.5–14.5)
WBC: 6.7 10*3/uL (ref 3.6–11.0)

## 2017-03-03 MED ORDER — OSELTAMIVIR PHOSPHATE 75 MG PO CAPS: 75.0000 mg | ORAL_CAPSULE | Freq: Two times a day (BID) | ORAL | 0 refills | Status: AC

## 2017-03-03 MED ORDER — SODIUM CHLORIDE 0.9 % IV BOLUS (SEPSIS)
1000.0000 mL | Freq: Once | INTRAVENOUS | Status: AC
  Administered 2017-03-03: 1000 mL via INTRAVENOUS

## 2017-03-03 MED ORDER — KETOROLAC TROMETHAMINE 30 MG/ML IJ SOLN
30.0000 mg | Freq: Once | INTRAMUSCULAR | Status: AC
  Administered 2017-03-04: 30 mg via INTRAVENOUS
  Filled 2017-03-03: qty 1

## 2017-03-03 NOTE — ED Notes (Signed)
Patient presents to ED with body aches, chills and stomach "burning". Patient states symptoms have been present for two days.

## 2017-03-03 NOTE — ED Triage Notes (Signed)
Pt states that she started having abd pain 2 days ago, pt states that she is hurting and aching all over, reports chills, nausea, no vomiting, no diarrhea, pt also reports clammy and sweating

## 2017-03-04 ENCOUNTER — Encounter: Payer: Self-pay | Admitting: Radiology

## 2017-03-04 ENCOUNTER — Emergency Department: Payer: Self-pay

## 2017-03-04 LAB — COMPREHENSIVE METABOLIC PANEL
ALBUMIN: 4.1 g/dL (ref 3.5–5.0)
ALK PHOS: 59 U/L (ref 38–126)
ALT: 19 U/L (ref 14–54)
AST: 13 U/L — ABNORMAL LOW (ref 15–41)
Anion gap: 10 (ref 5–15)
BUN: 13 mg/dL (ref 6–20)
CALCIUM: 10 mg/dL (ref 8.9–10.3)
CHLORIDE: 104 mmol/L (ref 101–111)
CO2: 24 mmol/L (ref 22–32)
CREATININE: 1.03 mg/dL — AB (ref 0.44–1.00)
GFR calc Af Amer: >60 mL/min (ref >60)
GFR calc non Af Amer: >60 mL/min (ref >60)
GLUCOSE: 111 mg/dL — AB (ref 65–99)
Potassium: 3.4 mmol/L — ABNORMAL LOW (ref 3.5–5.1)
SODIUM: 138 mmol/L (ref 135–145)
Total Bilirubin: 0.4 mg/dL (ref 0.3–1.2)
Total Protein: 7 g/dL (ref 6.5–8.1)

## 2017-03-04 LAB — INFLUENZA PANEL BY PCR (TYPE A & B)
INFLBPCR: NEGATIVE
Influenza A By PCR: NEGATIVE

## 2017-03-04 MED ORDER — ONDANSETRON HCL 4 MG/2ML IJ SOLN
INTRAMUSCULAR | Status: AC
  Administered 2017-03-04: 4 mg via INTRAVENOUS
  Filled 2017-03-04: qty 2

## 2017-03-04 MED ORDER — ONDANSETRON HCL 4 MG/2ML IJ SOLN
4.0000 mg | Freq: Once | INTRAMUSCULAR | Status: AC
  Administered 2017-03-04: 4 mg via INTRAVENOUS

## 2017-03-04 MED ORDER — MORPHINE SULFATE (PF) 2 MG/ML IV SOLN
INTRAVENOUS | Status: AC
  Administered 2017-03-04: 2 mg via INTRAVENOUS
  Filled 2017-03-04: qty 1

## 2017-03-04 MED ORDER — IOPAMIDOL (ISOVUE-300) INJECTION 61%
100.0000 mL | Freq: Once | INTRAVENOUS | Status: AC | PRN
  Administered 2017-03-04: 100 mL via INTRAVENOUS

## 2017-03-04 MED ORDER — DICYCLOMINE HCL 10 MG PO CAPS
10.0000 mg | ORAL_CAPSULE | Freq: Once | ORAL | Status: AC
  Administered 2017-03-04: 10 mg via ORAL
  Filled 2017-03-04: qty 1

## 2017-03-04 MED ORDER — MORPHINE SULFATE (PF) 2 MG/ML IV SOLN
2.0000 mg | Freq: Once | INTRAVENOUS | Status: AC
  Administered 2017-03-04: 2 mg via INTRAVENOUS

## 2017-03-04 MED ORDER — DICYCLOMINE HCL 20 MG PO TABS: 20.0000 mg | ORAL_TABLET | Freq: Three times a day (TID) | ORAL | 0 refills | Status: DC | PRN

## 2017-03-04 NOTE — ED Provider Notes (Signed)
[facility] Emergency Department Provider Note   First MD Initiated Contact with Patient 03/03/17 2339     (approximate)  I have reviewed the triage vital signs and the nursing notes.   HISTORY  Chief Complaint Abdominal Pain and Generalized Body Aches    HPI Jessica Mathews is a 47 y.o. female with history of asthma and irritable bowel syndrome presents to the emergency department with generalized body aches cough, chills nausea however no vomiting or diarrhea.  Patient states that she has had multiple sick contacts with the flu.  Patient also admits to abdominal pain which he states is currently 7 out of 10.  Patient denies any urinary symptoms   Past Medical History:  Diagnosis Date  . Asthma     There are no active problems to display for this patient.   Past Surgical History:  Procedure Laterality Date  . ABDOMINAL HYSTERECTOMY      Prior to Admission medications   Medication Sig Start Date End Date Taking? Authorizing Provider  dicyclomine (BENTYL) 20 MG tablet Take 1 tablet (20 mg total) by mouth 3 (three) times daily as needed for spasms. 03/04/17 03/04/18  Darci CurrentBrown, [place] N, MD  oseltamivir (TAMIFLU) 75 MG capsule Take 1 capsule (75 mg total) by mouth 2 (two) times daily for 5 days. 03/03/17 03/08/17  Darci CurrentBrown, [place] N, MD    Allergies Zithromax [azithromycin]  No family history on file.  Social History Social History   Tobacco Use  . Smoking status: Current Every Day Smoker    Packs/day: 0.50    Types: Cigarettes  . Smokeless tobacco: Never Used  Substance Use Topics  . Alcohol use: No  . Drug use: No    Review of Systems Constitutional: Positive for fever/chills Eyes: No visual changes. ENT: No sore throat. Cardiovascular: Denies chest pain. Respiratory: Denies shortness of breath.  Positive for cough Gastrointestinal: No abdominal pain.  No nausea, no vomiting.  No diarrhea.  No constipation. Genitourinary: Negative for  dysuria. Musculoskeletal: Negative for neck pain.  Negative for back pain.  Positive for generalized muscle aches Integumentary: Negative for rash. Neurological: Negative for headaches, focal weakness or numbness.   ____________________________________________   PHYSICAL EXAM:  VITAL SIGNS: ED Triage Vitals  Enc Vitals Group     BP 03/03/17 2334 (!) 147/94     Pulse Rate 03/03/17 2334 80     Resp 03/03/17 2334 18     Temp 03/03/17 2334 99.6 F (37.6 C)     Temp Source 03/03/17 2334 Oral     SpO2 03/03/17 2334 98 %     Weight 03/03/17 2335 75.8 kg (167 lb)     Height 03/03/17 2335 1.651 m (5\' 5" )     Head Circumference --      Peak Flow --      Pain Score 03/03/17 2334 10     Pain Loc --      Pain Edu? --      Excl. in GC? --     Constitutional: Alert and oriented. Well appearing and in no acute distress. Eyes: Conjunctivae are normal. Head: Atraumatic. Nose: Positive for congestion/rhinnorhea. Mouth/Throat: Mucous membranes are moist.  Oropharynx non-erythematous. Neck: No stridor.  No meningeal signs.   Cardiovascular: Normal rate, regular rhythm. Good peripheral circulation. Grossly normal heart sounds. Respiratory: Normal respiratory effort.  No retractions. Lungs CTAB. Gastrointestinal: Soft and nontender. No distention.  Musculoskeletal: No lower extremity tenderness nor edema. No gross deformities of extremities. Neurologic:  Normal speech and language. No gross focal neurologic deficits are appreciated.  Skin:  Skin is warm, dry and intact. No rash noted. Psychiatric: Mood and affect are normal. Speech and behavior are normal.  ____________________________________________   LABS (all labs ordered are listed, but only abnormal results are displayed)  Labs Reviewed  COMPREHENSIVE METABOLIC PANEL - Abnormal; Notable for the following components:      Result Value   Potassium 3.4 (*)    Glucose, Bld 111 (*)    Creatinine, Ser 1.03 (*)    AST 13 (*)    All  other components within normal limits  INFLUENZA PANEL BY PCR (TYPE A & B)  CBC     RADIOLOGY I, [place], personally viewed and evaluated these images (plain radiographs) as part of my medical decision making, as well as reviewing the written report by the radiologist.    Official radiology report(s): Ct Abdomen Pelvis W Contrast  Result Date: 03/04/2017 CLINICAL DATA:  47 y/o F; 2 days of abdominal pain with chills and nausea. EXAM: CT ABDOMEN AND PELVIS WITH CONTRAST TECHNIQUE: Multidetector CT imaging of the abdomen and pelvis was performed using the standard protocol following bolus administration of intravenous contrast. CONTRAST:  ISOVUE-300 IOPAMIDOL (ISOVUE-300) INJECTION 61% COMPARISON:  10/01/2014 CT abdomen and pelvis FINDINGS: Lower chest: No acute abnormality. Hepatobiliary: Stable 11 mm cyst in liver segment 2. No additional focal liver abnormality is seen. No gallstones, gallbladder wall thickening, or biliary dilatation. Pancreas: Unremarkable. No pancreatic ductal dilatation or surrounding inflammatory changes. Spleen: Normal in size without focal abnormality. Adrenals/Urinary Tract: Adrenal glands are unremarkable. Kidneys are normal, without renal calculi, focal lesion, or hydronephrosis. Bladder is unremarkable. Stomach/Bowel: Stomach is within normal limits. Appendix appears normal. No evidence of bowel wall thickening, distention, or inflammatory changes. Sigmoid diverticulosis, no findings of acute diverticulitis. Vascular/Lymphatic: Aortic atherosclerosis. No enlarged abdominal or pelvic lymph nodes. Reproductive: Status post hysterectomy. No adnexal masses. Other: No abdominal wall hernia or abnormality. No abdominopelvic ascites. Musculoskeletal: No acute or significant osseous findings. IMPRESSION: 1. No acute process identified. 2. Sigmoid diverticulosis, no findings of acute diverticulitis. 3. Aortic atherosclerosis. Electronically Signed   By: Mitzi Hansen M.D.   On: 03/04/2017 01:59     Procedures   ____________________________________________   INITIAL IMPRESSION / ASSESSMENT AND PLAN / ED COURSE  As part of my medical decision making, I reviewed the following data within the electronic MEDICAL RECORD [id number] year old female presented with multiple clinical complaints.  Consider the possibility of influenza given known sick contact with same however influenza was negative.  Patient continued to admit to abdominal discomfort and as such CT scan of the abdomen was performed which revealed evidence of diverticulosis but no acute findings.  As such I suspect patient's abdominal discomfort may be secondary to known history of IBS.  As such patient given Bentyl and will be prescribed the same for home.  Patient will be referred to gastroenterology for further outpatient evaluation. ____________________________________________  FINAL CLINICAL IMPRESSION(S) / ED DIAGNOSES  Final diagnoses:  Abdominal pain, unspecified abdominal location  Diverticulosis of large intestine without hemorrhage     MEDICATIONS GIVEN DURING THIS VISIT:  Medications  sodium chloride 0.9 % bolus 1,000 mL (0 mLs Intravenous Stopped 03/04/17 0245)  ketorolac (TORADOL) 30 MG/ML injection 30 mg (30 mg Intravenous Given 03/04/17 0000)  morphine 2 MG/ML injection 2 mg (2 mg Intravenous Given 03/04/17 0111)  ondansetron (ZOFRAN) injection 4 mg (4 mg Intravenous Given 03/04/17 0111)  iopamidol (  ISOVUE-300) 61 % injection 100 mL (100 mLs Intravenous Contrast Given 03/04/17 0139)  dicyclomine (BENTYL) capsule 10 mg (10 mg Oral Given 03/04/17 0235)     ED Discharge Orders        Ordered    dicyclomine (BENTYL) 20 MG tablet  3 times daily PRN     03/04/17 0227    oseltamivir (TAMIFLU) 75 MG capsule  2 times daily     03/03/17 2356       Note:  This document was prepared using Dragon voice recognition software and may include unintentional dictation  errors.    Darci Current, MD 03/04/17 [phone]

## 2017-07-26 ENCOUNTER — Emergency Department: Payer: Self-pay

## 2017-07-26 ENCOUNTER — Emergency Department
Admission: EM | Admit: 2017-07-26 | Discharge: 2017-07-26 | Disposition: A | Discharge status: Home / Self Care | Payer: Self-pay | Attending: Emergency Medicine | Admitting: Emergency Medicine

## 2017-07-26 DIAGNOSIS — G609 Hereditary and idiopathic neuropathy, unspecified: Secondary | ICD-10-CM | POA: Insufficient documentation

## 2017-07-26 DIAGNOSIS — R202 Paresthesia of skin: Secondary | ICD-10-CM | POA: Insufficient documentation

## 2017-07-26 DIAGNOSIS — J45909 Unspecified asthma, uncomplicated: Secondary | ICD-10-CM | POA: Insufficient documentation

## 2017-07-26 DIAGNOSIS — F17210 Nicotine dependence, cigarettes, uncomplicated: Secondary | ICD-10-CM | POA: Insufficient documentation

## 2017-07-26 LAB — BASIC METABOLIC PANEL
ANION GAP: 6 (ref 5–15)
BUN: 14 mg/dL (ref 6–20)
CO2: 28 mmol/L (ref 22–32)
Calcium: 9.3 mg/dL (ref 8.9–10.3)
Chloride: 105 mmol/L (ref 98–111)
Creatinine, Ser: 0.95 mg/dL (ref 0.44–1.00)
GFR calc Af Amer: >60 mL/min (ref >60)
GLUCOSE: 112 mg/dL — AB (ref 70–99)
Potassium: 3.6 mmol/L (ref 3.5–5.1)
Sodium: 139 mmol/L (ref 135–145)

## 2017-07-26 LAB — TROPONIN I: Troponin I: <0.03 ng/mL (ref <0.03)

## 2017-07-26 LAB — CBC
HCT: 40.4 % (ref 35.0–47.0)
HEMOGLOBIN: 13.8 g/dL (ref 12.0–16.0)
MCH: 31.8 pg (ref 26.0–34.0)
MCHC: 34.2 g/dL (ref 32.0–36.0)
MCV: 92.9 fL (ref 80.0–100.0)
Platelets: 212 10*3/uL (ref 150–440)
RBC: 4.35 MIL/uL (ref 3.80–5.20)
RDW: 13.3 % (ref 11.5–14.5)
WBC: 9.8 10*3/uL (ref 3.6–11.0)

## 2017-07-26 MED ORDER — GABAPENTIN 100 MG PO CAPS: 100.0000 mg | ORAL_CAPSULE | Freq: Three times a day (TID) | ORAL | 0 refills | Status: DC

## 2017-07-26 NOTE — ED Triage Notes (Signed)
Pt reports heaviness, numbness, and tingling to R arm/shoulder x 2-3 weeks.  Pt is A&Ox4, in NAD.  Pt states arm just feels weird.

## 2017-07-26 NOTE — ED Notes (Signed)

## 2017-07-26 NOTE — ED Notes (Signed)
Pt reports RUE numbness and tingling last 2 weeks that goes in to index finger. Pt denies inj or hx of this. Pt states extremity feels different. Pt able to move extrem. Pulses present.

## 2017-07-26 NOTE — ED Provider Notes (Signed)
[facility] Emergency Department Provider Note  Time seen: 2:17 AM  I have reviewed the triage vital signs and the nursing notes.   HISTORY  Chief Complaint Numbness    HPI Kasidee Voisin Risk is a 47 y.o. female with a past medical history of asthma presents to the emergency department for right upper extremity tingling.  According to the patient for the past 3 weeks she has been expensing tingling in her thumb and index finger.  States at times it will progress to her wrist but usually is just in the thumb and index finger.  Patient denies any numbness.  Denies any weakness.  Denies any headache confusion or slurred speech.  Denies any chest pain or shortness of breath.  Largely negative review of systems.  Patient states was bothering her tonight which is why she presented to the emergency department.   Past Medical History:  Diagnosis Date  . Asthma     There are no active problems to display for this patient.   Past Surgical History:  Procedure Laterality Date  . ABDOMINAL HYSTERECTOMY      Prior to Admission medications   Medication Sig Start Date End Date Taking? Authorizing Provider  dicyclomine (BENTYL) 20 MG tablet Take 1 tablet (20 mg total) by mouth 3 (three) times daily as needed for spasms. 03/04/17 03/04/18  Darci Current, MD    Allergies  Allergen Reactions  . Zithromax [Azithromycin] Hives    No family history on file.  Social History Social History   Tobacco Use  . Smoking status: Current Every Day Smoker    Packs/day: 0.50    Types: Cigarettes  . Smokeless tobacco: Never Used  Substance Use Topics  . Alcohol use: No  . Drug use: No    Review of Systems Constitutional: Negative for fever. Cardiovascular: Negative for chest pain. Respiratory: Negative for shortness of breath. Gastrointestinal: Negative for abdominal pain Musculoskeletal: Right thumb and index finger tingling Skin: Negative for skin complaints   Neurological: Negative for headache, slurred speech, confusion weakness or numbness All other ROS negative  ____________________________________________   PHYSICAL EXAM:  VITAL SIGNS: ED Triage Vitals  Enc Vitals Group     BP 07/26/17 0053 129/79     Pulse Rate 07/26/17 0053 81     Resp 07/26/17 0053 18     Temp 07/26/17 0053 98.7 F (37.1 C)     Temp Source 07/26/17 0053 Oral     SpO2 07/26/17 0053 100 %     Weight 07/26/17 0043 167 lb (75.8 kg)     Height 07/26/17 0043 5\' 5"  (1.651 m)     Head Circumference --      Peak Flow --      Pain Score 07/26/17 0042 9     Pain Loc --      Pain Edu? --      Excl. in GC? --     Constitutional: Alert and oriented. Well appearing and in no distress. Eyes: Normal exam ENT   Head: Normocephalic and atraumatic.   Mouth/Throat: Mucous membranes are moist. Cardiovascular: Normal rate, regular rhythm. Respiratory: Normal respiratory effort without tachypnea nor retractions. Breath sounds are clear Gastrointestinal: Soft and nontender. No distention.   Musculoskeletal: Nontender with normal range of motion in all extremities.  Neurologic:  Normal speech and language. No gross focal neurologic deficits.  Equal grip strength bilaterally.  Cranial nerves intact.  5/5 motor in all extremities.  Denies sensory deficit but states a tingling  sensation in the thumb and index finger. Skin:  Skin is warm, dry and intact.  Psychiatric: Mood and affect are normal.   ____________________________________________    EKG  EKG reviewed and interpreted by myself shows normal sinus rhythm 81 bpm, narrow QRS, normal axis, normal intervals, no ST changes.  ____________________________________________    RADIOLOGY  Chest x-ray negative  ____________________________________________   INITIAL IMPRESSION / ASSESSMENT AND PLAN / ED COURSE  Pertinent labs & imaging results that were available during my care of the patient were reviewed by me  and considered in my medical decision making (see chart for details).  Patient presents to the emergency department for tingling in her thumb and index finger.  States symptoms have been ongoing and somewhat intermittent over the past 3 weeks.  Denies any weakness or numbness.  Denies confusion, slurred speech.  Normal neurological exam.  Differential would include CVA, peripheral neuropathy.  Overall symptoms are most consistent with peripheral neuropathy, however I did discuss with the patient my recommendation to proceed with a CT scan of the head as a precaution.  Patient states she would prefer holding off on CT imaging at this time.  I discussed the likelihood of peripheral neuropathy, we will prescribe gabapentin and have the patient follow-up with her doctor.  I discussed return precautions.  Patient agreeable to plan.  ____________________________________________   FINAL CLINICAL IMPRESSION(S) / ED DIAGNOSES  Peripheral neuropathy    Minna AntisPaduchowski, Shewanda Sharpe, MD 07/26/17 [id number]

## 2017-10-17 ENCOUNTER — Emergency Department (HOSPITAL_COMMUNITY): Payer: Self-pay

## 2017-10-17 ENCOUNTER — Encounter (HOSPITAL_COMMUNITY): Payer: Self-pay | Admitting: Radiology

## 2017-10-17 ENCOUNTER — Emergency Department (HOSPITAL_COMMUNITY)
Admission: EM | Admit: 2017-10-17 | Discharge: 2017-10-18 | Disposition: A | Discharge status: Home / Self Care | Payer: Self-pay | Attending: Emergency Medicine | Admitting: Emergency Medicine

## 2017-10-17 DIAGNOSIS — G92 Toxic encephalopathy: Secondary | ICD-10-CM

## 2017-10-17 DIAGNOSIS — R2990 Unspecified symptoms and signs involving the nervous system: Secondary | ICD-10-CM

## 2017-10-17 DIAGNOSIS — J45909 Unspecified asthma, uncomplicated: Secondary | ICD-10-CM | POA: Insufficient documentation

## 2017-10-17 DIAGNOSIS — F17210 Nicotine dependence, cigarettes, uncomplicated: Secondary | ICD-10-CM | POA: Insufficient documentation

## 2017-10-17 DIAGNOSIS — R531 Weakness: Secondary | ICD-10-CM | POA: Insufficient documentation

## 2017-10-17 DIAGNOSIS — Z79899 Other long term (current) drug therapy: Secondary | ICD-10-CM | POA: Insufficient documentation

## 2017-10-17 DIAGNOSIS — R4789 Other speech disturbances: Secondary | ICD-10-CM | POA: Insufficient documentation

## 2017-10-17 DIAGNOSIS — R4781 Slurred speech: Secondary | ICD-10-CM

## 2017-10-17 DIAGNOSIS — R799 Abnormal finding of blood chemistry, unspecified: Secondary | ICD-10-CM | POA: Insufficient documentation

## 2017-10-17 LAB — DIFFERENTIAL
Abs Immature Granulocytes: 0.1 10*3/uL (ref 0.0–0.1)
Basophils Absolute: 0.1 10*3/uL (ref 0.0–0.1)
Basophils Relative: 1 %
Eosinophils Absolute: 0.1 10*3/uL (ref 0.0–0.7)
Eosinophils Relative: 1 %
IMMATURE GRANULOCYTES: 1 %
LYMPHS ABS: 2.4 10*3/uL (ref 0.7–4.0)
LYMPHS PCT: 21 %
Monocytes Absolute: 0.7 10*3/uL (ref 0.1–1.0)
Monocytes Relative: 6 %
Neutro Abs: 8.1 10*3/uL — ABNORMAL HIGH (ref 1.7–7.7)
Neutrophils Relative %: 70 %

## 2017-10-17 LAB — CBC
HEMATOCRIT: 34.5 % — AB (ref 36.0–46.0)
Hemoglobin: 11.3 g/dL — ABNORMAL LOW (ref 12.0–15.0)
MCH: 32.2 pg (ref 26.0–34.0)
MCHC: 32.8 g/dL (ref 30.0–36.0)
MCV: 98.3 fL (ref 78.0–100.0)
Platelets: 275 10*3/uL (ref 150–400)
RBC: 3.51 MIL/uL — ABNORMAL LOW (ref 3.87–5.11)
RDW: 12.9 % (ref 11.5–15.5)
WBC: 11.4 10*3/uL — AB (ref 4.0–10.5)

## 2017-10-17 LAB — I-STAT CHEM 8, ED
BUN: 10 mg/dL (ref 6–20)
CHLORIDE: 105 mmol/L (ref 98–111)
Calcium, Ion: 1.13 mmol/L — ABNORMAL LOW (ref 1.15–1.40)
Creatinine, Ser: 1 mg/dL (ref 0.44–1.00)
GLUCOSE: 101 mg/dL — AB (ref 70–99)
HCT: 39 % (ref 36.0–46.0)
Hemoglobin: 13.3 g/dL (ref 12.0–15.0)
Potassium: 3.6 mmol/L (ref 3.5–5.1)
Sodium: 138 mmol/L (ref 135–145)
TCO2: 22 mmol/L (ref 22–32)

## 2017-10-17 LAB — I-STAT TROPONIN, ED: Troponin i, poc: 0 ng/mL (ref 0.00–0.08)

## 2017-10-17 LAB — I-STAT BETA HCG BLOOD, ED (MC, WL, AP ONLY): I-stat hCG, quantitative: <5 m[IU]/mL (ref <5)

## 2017-10-17 LAB — ETHANOL

## 2017-10-17 LAB — APTT: aPTT: 32 seconds (ref 24–36)

## 2017-10-17 LAB — PROTIME-INR
INR: 1.03
Prothrombin Time: 13.4 seconds (ref 11.4–15.2)

## 2017-10-17 MED ORDER — IOPAMIDOL (ISOVUE-370) INJECTION 76%
50.0000 mL | Freq: Once | INTRAVENOUS | Status: AC | PRN
  Administered 2017-10-17: 50 mL via INTRAVENOUS

## 2017-10-17 NOTE — Consult Note (Addendum)
Neurology Consultation  Reason for Consult: Acute code stroke brought in by [facility] Referring Physician: Dr. Trish Mage  CC: Slurred speech generalized weakness  History is obtained from: Patient, EMS, chart  HPI: Jessica Mathews is a 47 y.o. female past medical history of chronic back pain and asthma, currently only on gabapentin, has not seen a doctor for many years, presenting for evaluation of sudden onset of slurred speech, imbalance and generalized weakness. She said that her symptoms started around 8 or 8:30 PM on 10/17/2017. She reports a myriad of preceding stressors including loss of multiple family members including son-in-law, taking care of her young daughter and young granddaughter and not being able to take care of herself as she does not have any health insurance. She was very tearful during the entire encounter and appeared very anxious. She could not tell me if she specifically had weakness on one side or the other.  She also reported of some chest discomfort during the way in the ambulance and a twelve-lead EKG was done which was unremarkable.  LKW: 8 PM on 10/17/2017 tpa given?: no, nonfocal examination 0  ROS: ROS was performed and is negative except as noted in the HPI.  Past Medical History:  Diagnosis Date  . Asthma    No family history on file.  Social History:   reports that she has been smoking cigarettes. She has been smoking about 0.50 packs per day. She has never used smokeless tobacco. She reports that she does not drink alcohol or use drugs.  +marijuana Denies alcohol or illicit drug use.  Medications No current facility-administered medications for this encounter.   Current Outpatient Medications:  .  dicyclomine (BENTYL) 20 MG tablet, Take 1 tablet (20 mg total) by mouth 3 (three) times daily as needed for spasms., Disp: 30 tablet, Rfl: 0 .  gabapentin (NEURONTIN) 100 MG capsule, Take 1 capsule (100 mg total) by mouth 3 (three) times daily.,  Disp: 30 capsule, Rfl: 0  Exam: Current vital signs: BP 120/70 (BP Location: Right Arm)   Pulse 72   Temp 98 F (36.7 C) (Oral)   Resp 18   Ht '5\' 5"'  (1.651 m)   Wt 83 kg   SpO2 98%   BMI 30.45 kg/m  Vital signs in last 24 hours: Temp:  [98 F (36.7 C)] 98 F (36.7 C) (09/26 2337) Pulse Rate:  [72] 72 (09/26 2337) Resp:  [18] 18 (09/26 2337) BP: (120)/(70) 120/70 (09/26 2337) SpO2:  [98 %] 98 % (09/26 2337) Weight:  [83 kg] 83 kg (09/26 2338) GENERAL: Awake, alert in NAD is very anxious HEENT: - Normocephalic and atraumatic, dry mm, no LN++, no Thyromegally LUNGS - Clear to auscultation bilaterally with no wheezes CV - S1S2 RRR, no m/r/g, equal pulses bilaterally. ABDOMEN - Soft, nontender, nondistended with normoactive BS Ext: warm, well perfused, intact peripheral pulses, no edema NEURO:  Mental Status: AA&Ox3, very anxious appearing Language: speech is mildly dysarthric.  Naming, repetition, fluency, and comprehension intact.  Poor attention concentration Cranial Nerves: PERRL. EOMI, visual fields full, she volitionally keeps the right angle of her mouth slightly closed but no other facial asymmetry, facial sensation intact, hearing intact, tongue/uvula/soft palate midline, normal sternocleidomastoid and trapezius muscle strength. No evidence of tongue atrophy or fibrillations Motor: 4/5 in all 4 extremities-effort dependent but with persuasion, no drift Tone: is normal and bulk is normal Sensation- Intact to light touch bilaterally Coordination: Difficult to assess Gait- deferred  NIHSS-1 for dysarthria  Labs  I have reviewed labs in epic and the results pertinent to this consultation are:  CBC    Component Value Date/Time   WBC 11.4 (H) 10/17/2017 2311   RBC 3.51 (L) 10/17/2017 2311   HGB 13.3 10/17/2017 2325   HGB 13.7 11/16/2013 1743   HCT 39.0 10/17/2017 2325   HCT 42.1 11/16/2013 1743   PLT 275 10/17/2017 2311   PLT 200 11/16/2013 1743   MCV 98.3  10/17/2017 2311   MCV 96 11/16/2013 1743   MCH 32.2 10/17/2017 2311   MCHC 32.8 10/17/2017 2311   RDW 12.9 10/17/2017 2311   RDW 12.9 11/16/2013 1743   LYMPHSABS 2.4 10/17/2017 2311   LYMPHSABS 2.0 11/16/2013 1743   MONOABS 0.7 10/17/2017 2311   MONOABS 0.5 11/16/2013 1743   EOSABS 0.1 10/17/2017 2311   EOSABS 0.2 11/16/2013 1743   BASOSABS 0.1 10/17/2017 2311   BASOSABS 0.1 11/16/2013 1743   CMP     Component Value Date/Time   NA 138 10/17/2017 2325   NA 143 11/16/2013 1743   K 3.6 10/17/2017 2325   K 3.8 11/16/2013 1743   CL 105 10/17/2017 2325   CL 109 (H) 11/16/2013 1743   CO2 28 07/26/2017 0101   CO2 30 11/16/2013 1743   GLUCOSE 101 (H) 10/17/2017 2325   GLUCOSE 113 (H) 11/16/2013 1743   BUN 10 10/17/2017 2325   BUN 7 11/16/2013 1743   CREATININE 1.00 10/17/2017 2325   CREATININE 0.96 11/16/2013 1743   CALCIUM 9.3 07/26/2017 0101   CALCIUM 8.2 (L) 11/16/2013 1743   PROT 7.0 03/03/2017 2348   PROT 5.9 (L) 11/16/2013 1743   ALBUMIN 4.1 03/03/2017 2348   ALBUMIN 3.1 (L) 11/16/2013 1743   AST 13 (L) 03/03/2017 2348   AST 17 11/16/2013 1743   ALT 19 03/03/2017 2348   ALT 19 11/16/2013 1743   ALKPHOS 59 03/03/2017 2348   ALKPHOS 64 11/16/2013 1743   BILITOT 0.4 03/03/2017 2348   BILITOT 0.2 11/16/2013 1743   GFRNONAA >60 07/26/2017 0101   GFRNONAA >60 11/16/2013 1743   GFRNONAA >60 09/17/2013 1040   GFRAA >60 07/26/2017 0101   GFRAA >60 11/16/2013 1743   GFRAA >60 09/17/2013 1040  Imaging I have reviewed the images obtained:  CT-scan of the brain without contrast as well as CTA head and neck did not show any acute abnormality.  No evidence of bleed, evolving ischemic stroke or emergent large vessel occlusion.  Assessment:  47 year old woman with past medical history of chronic back pain, asthma, currently only taking gabapentin, with no established medical care for many years, presented for evaluation of sudden onset of slurred speech and balance and  generalized weakness.  Her exam was essentially nonfocal but she was extremely anxious and cry full during the entire encounter.  She reported multiple stressors ongoing including untimely deaths of multiple family members and to many family responsibilities that she is taking care of at this time. Her twelve-lead EKG was normal-it was done in response to complain of chest tightness during her way to the hospital in the ambulance. CT of the head, CTA head and neck were all unremarkable. With a nonfocal neurological exam with the exception of dysarthria which is extremely non-localizable, normal CT head and CTA head and neck, she was not a candidate for TPA. No emergent large vessel occlusion on CTA head and neck hence not a candidate for endovascular thrombectomy. Likely toxic metabolic encephalopathy versus conversion disorder in the setting of multiple stressors.  Impression:  Strokelike symptoms-slurred speech, general is weakness Toxic metabolic encephalopathy versus conversion disorder  Recommendations: Brain imaging in the form of noncontrast CT of the head and CTA head and neck have been completed and are unremarkable. If symptoms persist and concern for stroke remains, an MRI can be performed, but at this time I think stroke is lower on the differential. Check basic labs including CBC BMP. Also check urinalysis Also check urine drug screen Check alcohol levels Medical management per primary team as you are. If with supportive treatment, she improves and is back to her baseline, I do not think she needs any further neurological work-up at that time. I have communicated my plan to the emergency room provider Dr. Stark Jock in the emergency room. I will follow-up on the results from the lab studies and update recommendations as necessary.  -- Amie Portland, MD [facility] Pager: [phone] If 7pm to 7am, please call on call as listed on AMION.

## 2017-10-17 NOTE — ED Triage Notes (Signed)
  Patient BIB EMS for slurred speech, bilateral arm numbness, and headache.  Last known normal was 2130.  Patient is A&O x4.  Code stroke called and patient taken straight to CT scanner.  CT normal and TPA not ordered.  Patient is tearful and asking for help with social issues.  Patient states she has had a rough couple years with family issues.  Dr. Wilford Corner and Onalee Hua RN at bedside.

## 2017-10-17 NOTE — ED Provider Notes (Signed)
[facility] EMERGENCY DEPARTMENT Provider Note   CSN: [id number] Arrival date & time: 10/17/17  2309     History   Chief Complaint Chief Complaint  Patient presents with  . Code Stroke    HPI Jessica Mathews is a 47 y.o. female.  The history is provided by the patient and medical records.    47 y.o. F with hx of asthma and chronic back pain, presenting to the ED as a code stroke.  Reports around 8 -8:30 PM she started having slurred speech, generalized weakness, and feeling off balance.  Prior to this she was in her usual state of health.  States all of a sudden she just felt like "all her nerves went crazy".  Cannot lateralize if symptoms were more dominant or right or left side.  Denies prior hx of TIA or stroke.  No recent headache or head trauma.  Not on anticoagulation.  She does admit to a lot of stress recently-- several deaths in the family including her son-in-law earlier this month.  During evaluation, patient dwelling a lot on the death of her mother 2 years ago as she was her primary care giver for several years, states she does not know how to function without her around.   She does admit a lot of anxiety, not currently on medications for this.  Was previously on xanax and seemed to help her a lot.    Family reports she is back to baseline at time of my evaluation.  Past Medical History:  Diagnosis Date  . Asthma     There are no active problems to display for this patient.   Past Surgical History:  Procedure Laterality Date  . ABDOMINAL HYSTERECTOMY       OB History   None      Home Medications    Prior to Admission medications   Medication Sig Start Date End Date Taking? Authorizing Provider  dicyclomine (BENTYL) 20 MG tablet Take 1 tablet (20 mg total) by mouth 3 (three) times daily as needed for spasms. 03/04/17 03/04/18  Darci Current, MD  gabapentin (NEURONTIN) 100 MG capsule Take 1 capsule (100 mg total) by mouth 3 (three) times  daily. 07/26/17 08/25/17  Minna Antis, MD    Family History History reviewed. No pertinent family history.  Social History Social History   Tobacco Use  . Smoking status: Current Every Day Smoker    Packs/day: 0.50    Types: Cigarettes  . Smokeless tobacco: Never Used  Substance Use Topics  . Alcohol use: No  . Drug use: No     Allergies   Zithromax [azithromycin]   Review of Systems Review of Systems  Neurological: Positive for speech difficulty and weakness.  All other systems reviewed and are negative.    Physical Exam Updated Vital Signs BP 120/70 (BP Location: Right Arm)   Pulse 72   Temp 98 F (36.7 C) (Oral)   Resp 18   Ht 5\' 5"  (1.651 m)   Wt 83 kg   SpO2 98%   BMI 30.45 kg/m   Physical Exam  Constitutional: She is oriented to person, place, and time. She appears well-developed and well-nourished.  Sitting up in bed, texting on cell phone, talking with family at bedside  HENT:  Head: Normocephalic and atraumatic.  Mouth/Throat: Oropharynx is clear and moist.  Very poor dentition  Eyes: Pupils are equal, round, and reactive to light. Conjunctivae and EOM are normal.  Neck: Normal range of motion.  Cardiovascular: Normal rate, regular rhythm and normal heart sounds.  Pulmonary/Chest: Effort normal and breath sounds normal.  Abdominal: Soft. Bowel sounds are normal.  Musculoskeletal: Normal range of motion.  Neurological: She is alert and oriented to person, place, and time.  AAOx3, answering questions and following commands appropriately; equal strength UE and LE bilaterally; CN grossly intact; moves all extremities appropriately without ataxia; no focal neuro deficits or facial asymmetry appreciated, speech clear and goal oriented  Skin: Skin is warm and dry.  Psychiatric: Her mood appears anxious.  Tearful and very anxious during entire exam  Nursing note and vitals reviewed.    ED Treatments / Results  Labs (all labs ordered are listed,  but only abnormal results are displayed) Labs Reviewed  CBC - Abnormal; Notable for the following components:      Result Value   WBC 11.4 (*)    RBC 3.51 (*)    Hemoglobin 11.3 (*)    HCT 34.5 (*)    All other components within normal limits  DIFFERENTIAL - Abnormal; Notable for the following components:   Neutro Abs 8.1 (*)    All other components within normal limits  COMPREHENSIVE METABOLIC PANEL - Abnormal; Notable for the following components:   CO2 21 (*)    Glucose, Bld 102 (*)    Total Protein 5.9 (*)    All other components within normal limits  I-STAT CHEM 8, ED - Abnormal; Notable for the following components:   Glucose, Bld 101 (*)    Calcium, Ion 1.13 (*)    All other components within normal limits  ETHANOL  PROTIME-INR  APTT  RAPID URINE DRUG SCREEN, HOSP PERFORMED  URINALYSIS, ROUTINE W REFLEX MICROSCOPIC  I-STAT TROPONIN, ED  I-STAT BETA HCG BLOOD, ED ([facility], [facility], [facility] ONLY)    EKG None  Radiology Ct Angio Head W Or Wo Contrast  Result Date: 10/17/2017 CLINICAL DATA:  Code stroke. Initial evaluation for acute slurred speech, bilateral arm drift. EXAM: CT ANGIOGRAPHY HEAD AND NECK TECHNIQUE: Multidetector CT imaging of the head and neck was performed using the standard protocol during bolus administration of intravenous contrast. Multiplanar CT image reconstructions and MIPs were obtained to evaluate the vascular anatomy. Carotid stenosis measurements (when applicable) are obtained utilizing NASCET criteria, using the distal internal carotid diameter as the denominator. CONTRAST:  50mL ISOVUE-370 IOPAMIDOL (ISOVUE-370) INJECTION 76% COMPARISON:  None. FINDINGS: CT HEAD FINDINGS Brain: Cerebral volume within normal limits for patient age. No evidence for acute intracranial hemorrhage. No findings to suggest acute large vessel territory infarct. No mass lesion, midline shift, or mass effect. Ventricles are normal in size without evidence for hydrocephalus. No extra-axial  fluid collection identified. 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 Vascular: No hyperdense vessel identified. Skull: Scalp soft tissues demonstrate no acute abnormality. Calvarium intact. Sinuses/Orbits: Globes and orbital soft tissues within normal limits. Mild circumferential mucosal thickening about the maxillary sinuses, right greater than left. Paranasal sinuses are otherwise largely clear. No mastoid effusion. ASPECTS ([place] Stroke Program Early CT Score) - Ganglionic level infarction (caudate, lentiform nuclei, internal capsule, insula, M1-M3 cortex): 7 - Supraganglionic infarction (M4-M6 cortex): 3 Total score (0-10 with 10 being normal): 10 CTA NECK FINDINGS Aortic arch: Visualized aortic arch of normal caliber with normal branch pattern. Mild plaque about the origin of the great vessels without hemodynamically significant stenosis. Visualized subclavian arteries widely patent. Right carotid system: Right common and internal carotid  arteries widely patent without stenosis, dissection, or occlusion. No atheromatous narrowing about the right carotid bifurcation. Left carotid system: Left common carotid artery widely patent from its origin to the bifurcation. Minimal atheromatous plaque about the left bifurcation without stenosis. Left ICA widely patent from the bifurcation to the skull base without stenosis, dissection, or occlusion. Vertebral arteries: Both of the vertebral arteries arise from the subclavian arteries. Vertebral arteries widely patent within the neck without stenosis, dissection, or occlusion. Skeleton: No acute osseous abnormality. No discrete lytic or blastic osseous lesions. Moderate cervical spondylolysis at C5-6 and C6-7. Other neck: No other acute soft tissue abnormality within the neck. Salivary glands normal. Subcentimeter calcification noted within the right lobe of thyroid. Thyroid otherwise  unremarkable. No adenopathy. Upper chest: Visualized upper chest within normal limits. Prominent emphysematous changes present within the visualized lungs. Review of the MIP images confirms the above findings CTA HEAD FINDINGS Anterior circulation: Internal carotid arteries widely patent to the termini without stenosis. A1 segments, anterior communicating artery common anterior cerebral arteries widely patent bilaterally. M1 segments widely patent without stenosis or occlusion. Normal MCA bifurcations. No proximal M2 occlusion. Distal MCA branches well perfused and symmetric. Posterior circulation: Vertebral arteries widely patent to the vertebrobasilar junction without stenosis. Left PICA patent. Right PICA not visualized. Basilar artery widely patent to its distal aspect. Superior cerebral arteries patent bilaterally. Fetal type origin of the left PCA, supplied by a prominent and widely patent left posterior communicating artery. Right PCA supplied via the basilar. PCAs widely patent to their distal aspects without stenosis. Venous sinuses: Patent. Anatomic variants: Fetal type origin of the left PCA. No intracranial aneurysm. Delayed phase: Not performed. Review of the MIP images confirms the above findings IMPRESSION: CT HEAD IMPRESSION: 1. Negative head CT.  No acute intracranial abnormality. 2. ASPECTS = 10. 3. Chiari 1 malformation. CTA HEAD AND NECK IMPRESSION: 1. Negative CTA of the head and neck. No emergent large vessel occlusion. No hemodynamically significant or correctable stenosis identified. 2. Emphysema. These results were communicated to [place] at 11:32 pmon 9/26/2019by text page via the [facility] messaging system. Electronically Signed   By: Rise Mu M.D.   On: 10/17/2017 23:53   Ct Angio Neck W Or Wo Contrast  Result Date: 10/17/2017 CLINICAL DATA:  Code stroke. Initial evaluation for acute slurred speech, bilateral arm drift. EXAM: CT ANGIOGRAPHY HEAD AND NECK TECHNIQUE: Multidetector  CT imaging of the head and neck was performed using the standard protocol during bolus administration of intravenous contrast. Multiplanar CT image reconstructions and MIPs were obtained to evaluate the vascular anatomy. Carotid stenosis measurements (when applicable) are obtained utilizing NASCET criteria, using the distal internal carotid diameter as the denominator. CONTRAST:  50mL ISOVUE-370 IOPAMIDOL (ISOVUE-370) INJECTION 76% COMPARISON:  None. FINDINGS: CT HEAD FINDINGS Brain: Cerebral volume within normal limits for patient age. No evidence for acute intracranial hemorrhage. No findings to suggest acute large vessel territory infarct. No mass lesion, midline shift, or mass effect. Ventricles are normal in size without evidence for hydrocephalus. No extra-axial fluid collection identified. 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 Vascular: No hyperdense vessel identified. Skull: Scalp soft tissues demonstrate no acute abnormality. Calvarium intact. Sinuses/Orbits: Globes and orbital soft tissues within normal limits. Mild circumferential mucosal thickening about the maxillary sinuses, right greater than left. Paranasal sinuses are otherwise largely clear. No mastoid effusion. ASPECTS [facility] Stroke Program Early CT Score) - Ganglionic level infarction (caudate,  lentiform nuclei, internal capsule, insula, M1-M3 cortex): 7 - Supraganglionic infarction (M4-M6 cortex): 3 Total score (0-10 with 10 being normal): 10 CTA NECK FINDINGS Aortic arch: Visualized aortic arch of normal caliber with normal branch pattern. Mild plaque about the origin of the great vessels without hemodynamically significant stenosis. Visualized subclavian arteries widely patent. Right carotid system: Right common and internal carotid arteries widely patent without stenosis, dissection, or occlusion. No atheromatous narrowing about the right carotid  bifurcation. Left carotid system: Left common carotid artery widely patent from its origin to the bifurcation. Minimal atheromatous plaque about the left bifurcation without stenosis. Left ICA widely patent from the bifurcation to the skull base without stenosis, dissection, or occlusion. Vertebral arteries: Both of the vertebral arteries arise from the subclavian arteries. Vertebral arteries widely patent within the neck without stenosis, dissection, or occlusion. Skeleton: No acute osseous abnormality. No discrete lytic or blastic osseous lesions. Moderate cervical spondylolysis at C5-6 and C6-7. Other neck: No other acute soft tissue abnormality within the neck. Salivary glands normal. Subcentimeter calcification noted within the right lobe of thyroid. Thyroid otherwise unremarkable. No adenopathy. Upper chest: Visualized upper chest within normal limits. Prominent emphysematous changes present within the visualized lungs. Review of the MIP images confirms the above findings CTA HEAD FINDINGS Anterior circulation: Internal carotid arteries widely patent to the termini without stenosis. A1 segments, anterior communicating artery common anterior cerebral arteries widely patent bilaterally. M1 segments widely patent without stenosis or occlusion. Normal MCA bifurcations. No proximal M2 occlusion. Distal MCA branches well perfused and symmetric. Posterior circulation: Vertebral arteries widely patent to the vertebrobasilar junction without stenosis. Left PICA patent. Right PICA not visualized. Basilar artery widely patent to its distal aspect. Superior cerebral arteries patent bilaterally. Fetal type origin of the left PCA, supplied by a prominent and widely patent left posterior communicating artery. Right PCA supplied via the basilar. PCAs widely patent to their distal aspects without stenosis. Venous sinuses: Patent. Anatomic variants: Fetal type origin of the left PCA. No intracranial aneurysm. Delayed phase: Not  performed. Review of the MIP images confirms the above findings IMPRESSION: CT HEAD IMPRESSION: 1. Negative head CT.  No acute intracranial abnormality. 2. ASPECTS = 10. 3. Chiari 1 malformation. CTA HEAD AND NECK IMPRESSION: 1. Negative CTA of the head and neck. No emergent large vessel occlusion. No hemodynamically significant or correctable stenosis identified. 2. Emphysema. These results were communicated to [place] at 11:32 pmon 9/26/2019by text page via the [facility] messaging system. Electronically Signed   By: Rise Mu M.D.   On: 10/17/2017 23:53   Ct Head Code Stroke Wo Contrast  Result Date: 10/17/2017 CLINICAL DATA:  Code stroke. Initial evaluation for acute slurred speech, bilateral arm drift. EXAM: CT ANGIOGRAPHY HEAD AND NECK TECHNIQUE: Multidetector CT imaging of the head and neck was performed using the standard protocol during bolus administration of intravenous contrast. Multiplanar CT image reconstructions and MIPs were obtained to evaluate the vascular anatomy. Carotid stenosis measurements (when applicable) are obtained utilizing NASCET criteria, using the distal internal carotid diameter as the denominator. CONTRAST:  50mL ISOVUE-370 IOPAMIDOL (ISOVUE-370) INJECTION 76% COMPARISON:  None. FINDINGS: CT HEAD FINDINGS Brain: Cerebral volume within normal limits for patient age. No evidence for acute intracranial hemorrhage. No findings to suggest acute large vessel territory infarct. No mass lesion, midline shift, or mass effect. Ventricles are normal in size without evidence for hydrocephalus. No extra-axial fluid collection identified. 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 Vascular: No hyperdense vessel identified. Skull: Scalp soft tissues demonstrate no acute abnormality. Calvarium intact. Sinuses/Orbits: Globes and orbital soft tissues within normal limits. Mild circumferential mucosal  thickening about the maxillary sinuses, right greater than left. Paranasal sinuses are otherwise largely clear. No mastoid effusion. ASPECTS ([place] Stroke Program Early CT Score) - Ganglionic level infarction (caudate, lentiform nuclei, internal capsule, insula, M1-M3 cortex): 7 - Supraganglionic infarction (M4-M6 cortex): 3 Total score (0-10 with 10 being normal): 10 CTA NECK FINDINGS Aortic arch: Visualized aortic arch of normal caliber with normal branch pattern. Mild plaque about the origin of the great vessels without hemodynamically significant stenosis. Visualized subclavian arteries widely patent. Right carotid system: Right common and internal carotid arteries widely patent without stenosis, dissection, or occlusion. No atheromatous narrowing about the right carotid bifurcation. Left carotid system: Left common carotid artery widely patent from its origin to the bifurcation. Minimal atheromatous plaque about the left bifurcation without stenosis. Left ICA widely patent from the bifurcation to the skull base without stenosis, dissection, or occlusion. Vertebral arteries: Both of the vertebral arteries arise from the subclavian arteries. Vertebral arteries widely patent within the neck without stenosis, dissection, or occlusion. Skeleton: No acute osseous abnormality. No discrete lytic or blastic osseous lesions. Moderate cervical spondylolysis at C5-6 and C6-7. Other neck: No other acute soft tissue abnormality within the neck. Salivary glands normal. Subcentimeter calcification noted within the right lobe of thyroid. Thyroid otherwise unremarkable. No adenopathy. Upper chest: Visualized upper chest within normal limits. Prominent emphysematous changes present within the visualized lungs. Review of the MIP images confirms the above findings CTA HEAD FINDINGS Anterior circulation: Internal carotid arteries widely patent to the termini without stenosis. A1 segments, anterior communicating artery common  anterior cerebral arteries widely patent bilaterally. M1 segments widely patent without stenosis or occlusion. Normal MCA bifurcations. No proximal M2 occlusion. Distal MCA branches well perfused and symmetric. Posterior circulation: Vertebral arteries widely patent to the vertebrobasilar junction without stenosis. Left PICA patent. Right PICA not visualized. Basilar artery widely patent to its distal aspect. Superior cerebral arteries patent bilaterally. Fetal type origin of the left PCA, supplied by a prominent and widely patent left posterior communicating artery. Right PCA supplied via the basilar. PCAs widely patent to their distal aspects without stenosis. Venous sinuses: Patent. Anatomic variants: Fetal type origin of the left PCA. No intracranial aneurysm. Delayed phase: Not performed. Review of the MIP images confirms the above findings IMPRESSION: CT HEAD IMPRESSION: 1. Negative head CT.  No acute intracranial abnormality. 2. ASPECTS = 10. 3. Chiari 1 malformation. CTA HEAD AND NECK IMPRESSION: 1. Negative CTA of the head and neck. No emergent large vessel occlusion. No hemodynamically significant or correctable stenosis identified. 2. Emphysema. These results were communicated to [place] at 11:32 pmon 9/26/2019by text page via the [facility] messaging system. Electronically Signed   By: Rise Mu M.D.   On: 10/17/2017 23:53    Procedures Procedures (including critical care time)  Medications Ordered in ED Medications  iopamidol (ISOVUE-370) 76 % injection 50 mL (50 mLs Intravenous Contrast Given 10/17/17 2322)  LORazepam (ATIVAN) tablet 1 mg (1 mg Oral Given 10/18/17 0034)     Initial Impression / Assessment and Plan / ED Course  I have reviewed the triage vital signs and the nursing notes.  Pertinent labs & imaging results that were available during my care of the patient were reviewed by me and considered in my medical decision making (see chart for details).  47 year old female  initially presenting to the ED as a code stroke.  Around 8 to 8:30 PM she started noticing slurred speech, bilateral arm weakness.  By time of my evaluation family reports she is back to baseline.  She is sitting up in bed, texting on her cell phone, and talking with family members at the bedside.  Neurologic exam is nonfocal, speech is clear and goal oriented.  Her work-up is overall reassuring including labs, CT/CTA of head.  She has been evaluated by neurology, feels stroke is less likely at this time.  I agree with this assessment and do not feel she needs further work-up from neurologic standpoint as it seems that the majority of her symptoms are secondary to stress and anxiety.  She is extremely anxious and tearful throughout entire exam, notably dwelling on deaths in her family recently and the death of her mother that occurred 2 years ago.  Admits tonight she felt like "all her nerves were messed up".  Was previously on Xanax, tolerated this well but has been off of this for several years.  She has not been evaluated by a doctor in several years, states no health insurance at this time.  Give dose of ativan here, responded well.  She is eating/drinking well at this time.  Remains neurologically intact and wants to go home.  Feel she is stable for discharge.  Given small prescription for ativan PRN and will refer to patient care center for follow-up.    She is to return here for any new/acute changes.  Final Clinical Impressions(s) / ED Diagnoses   Final diagnoses:  Slurred speech  Weakness    ED Discharge Orders         Ordered    LORazepam (ATIVAN) 1 MG tablet  3 times daily PRN     10/18/17 0041           Garlon Hatchet, PA-C 10/18/17 1610    Geoffery Lyons, MD 10/18/17 [phone]

## 2017-10-18 LAB — COMPREHENSIVE METABOLIC PANEL
ALBUMIN: 3.8 g/dL (ref 3.5–5.0)
ALK PHOS: 52 U/L (ref 38–126)
ALT: 12 U/L (ref 0–44)
AST: 16 U/L (ref 15–41)
Anion gap: 8 (ref 5–15)
BILIRUBIN TOTAL: 0.3 mg/dL (ref 0.3–1.2)
BUN: 10 mg/dL (ref 6–20)
CO2: 21 mmol/L — ABNORMAL LOW (ref 22–32)
CREATININE: 0.98 mg/dL (ref 0.44–1.00)
Calcium: 9.1 mg/dL (ref 8.9–10.3)
Chloride: 108 mmol/L (ref 98–111)
GFR calc Af Amer: >60 mL/min (ref >60)
GFR calc non Af Amer: >60 mL/min (ref >60)
GLUCOSE: 102 mg/dL — AB (ref 70–99)
Potassium: 3.7 mmol/L (ref 3.5–5.1)
Sodium: 137 mmol/L (ref 135–145)
TOTAL PROTEIN: 5.9 g/dL — AB (ref 6.5–8.1)

## 2017-10-18 MED ORDER — LORAZEPAM 1 MG PO TABS: 1.0000 mg | ORAL_TABLET | Freq: Three times a day (TID) | ORAL | 0 refills | Status: DC | PRN

## 2017-10-18 MED ORDER — LORAZEPAM 1 MG PO TABS
1.0000 mg | ORAL_TABLET | Freq: Once | ORAL | Status: AC
  Administered 2017-10-18: 1 mg via ORAL
  Filled 2017-10-18: qty 1

## 2017-10-18 NOTE — Discharge Instructions (Signed)
Take the prescribed medication as directed for anxiety.  Try to make sure to rest and relax. Follow-up with the patient care center-- call for appt. Return to the ED for new or worsening symptoms.

## 2017-10-22 ENCOUNTER — Ambulatory Visit: Payer: Self-pay | Admitting: Family Medicine

## 2017-10-23 ENCOUNTER — Ambulatory Visit: Payer: Self-pay | Admitting: Family Medicine

## 2017-10-25 ENCOUNTER — Ambulatory Visit (INDEPENDENT_AMBULATORY_CARE_PROVIDER_SITE_OTHER): Payer: Self-pay | Admitting: Family Medicine

## 2017-10-25 ENCOUNTER — Encounter: Payer: Self-pay | Admitting: Family Medicine

## 2017-10-25 VITALS — BP 122/80 | HR 88 | Temp 98.3°F | Ht 65.0 in | Wt 174.0 lb

## 2017-10-25 DIAGNOSIS — Z131 Encounter for screening for diabetes mellitus: Secondary | ICD-10-CM

## 2017-10-25 DIAGNOSIS — G8929 Other chronic pain: Secondary | ICD-10-CM

## 2017-10-25 DIAGNOSIS — M5441 Lumbago with sciatica, right side: Secondary | ICD-10-CM

## 2017-10-25 DIAGNOSIS — K5792 Diverticulitis of intestine, part unspecified, without perforation or abscess without bleeding: Secondary | ICD-10-CM

## 2017-10-25 DIAGNOSIS — B379 Candidiasis, unspecified: Secondary | ICD-10-CM

## 2017-10-25 DIAGNOSIS — R609 Edema, unspecified: Secondary | ICD-10-CM

## 2017-10-25 DIAGNOSIS — G4700 Insomnia, unspecified: Secondary | ICD-10-CM

## 2017-10-25 DIAGNOSIS — N390 Urinary tract infection, site not specified: Secondary | ICD-10-CM

## 2017-10-25 DIAGNOSIS — Z23 Encounter for immunization: Secondary | ICD-10-CM

## 2017-10-25 DIAGNOSIS — Z09 Encounter for follow-up examination after completed treatment for conditions other than malignant neoplasm: Secondary | ICD-10-CM

## 2017-10-25 DIAGNOSIS — F419 Anxiety disorder, unspecified: Secondary | ICD-10-CM

## 2017-10-25 LAB — POCT URINALYSIS DIP (MANUAL ENTRY)
Bilirubin, UA: NEGATIVE
Blood, UA: NEGATIVE
Glucose, UA: NEGATIVE mg/dL
Ketones, POC UA: NEGATIVE mg/dL
Nitrite, UA: NEGATIVE
Spec Grav, UA: 1.025 (ref 1.010–1.025)
Urobilinogen, UA: 1 E.U./dL
pH, UA: 6 (ref 5.0–8.0)

## 2017-10-25 LAB — POCT GLYCOSYLATED HEMOGLOBIN (HGB A1C): Hemoglobin A1C: 5.4 % (ref 4.0–5.6)

## 2017-10-25 MED ORDER — BUSPIRONE HCL 5 MG PO TABS: 5.0000 mg | ORAL_TABLET | Freq: Two times a day (BID) | ORAL | 1 refills | Status: AC

## 2017-10-25 MED ORDER — GABAPENTIN 100 MG PO CAPS: 100.0000 mg | ORAL_CAPSULE | Freq: Three times a day (TID) | ORAL | 2 refills | Status: DC

## 2017-10-25 MED ORDER — TRAZODONE HCL 50 MG PO TABS: 25.0000 mg | ORAL_TABLET | Freq: Every evening | ORAL | 3 refills | Status: AC | PRN

## 2017-10-25 MED ORDER — FUROSEMIDE 20 MG PO TABS: 20.0000 mg | ORAL_TABLET | Freq: Every day | ORAL | 1 refills | Status: AC

## 2017-10-25 MED ORDER — DICYCLOMINE HCL 20 MG PO TABS: 20.0000 mg | ORAL_TABLET | Freq: Three times a day (TID) | ORAL | 1 refills | Status: AC | PRN

## 2017-10-25 MED ORDER — SULFAMETHOXAZOLE-TRIMETHOPRIM 800-160 MG PO TABS: 1.0000 | ORAL_TABLET | Freq: Two times a day (BID) | ORAL | 0 refills | Status: DC

## 2017-10-25 MED ORDER — GABAPENTIN 100 MG PO CAPS: ORAL_CAPSULE | ORAL | 3 refills | Status: AC

## 2017-10-25 MED ORDER — FLUCONAZOLE 150 MG PO TABS: 150.0000 mg | ORAL_TABLET | Freq: Once | ORAL | 0 refills | Status: AC

## 2017-10-25 MED FILL — FLUCONAZOLE 150 MG TABS: 150 | 1 days supply | Qty: 1 | Fill #0

## 2017-10-25 MED FILL — busPIRone HCL 5 MG TABS: 5 | 30 days supply | Qty: 60 | Fill #0

## 2017-10-25 MED FILL — traZODone HCL 50 MG TABS: 50 | 30 days supply | Qty: 30 | Fill #0

## 2017-10-25 MED FILL — GABAPENTIN 100 MG CAPSULE: 100 | 15 days supply | Qty: 90 | Fill #0

## 2017-10-25 MED FILL — FUROSEMIDE 20 MG TABLET: 20 | 30 days supply | Qty: 30 | Fill #0

## 2017-10-25 MED FILL — SULFAMETHOXAZOLE-TMP DS TAB: 800-160 | 7 days supply | Qty: 14 | Fill #0

## 2017-10-25 NOTE — Patient Instructions (Addendum)
Gabapentin capsules or tablets What is this medicine? GABAPENTIN (GA ba pen tin) is used to control partial seizures in adults with epilepsy. It is also used to treat certain types of nerve pain. This medicine may be used for other purposes; ask your health care provider or pharmacist if you have questions. COMMON BRAND NAME(S): Active-PAC with Gabapentin, Gabarone, Neurontin What should I tell my health care provider before I take this medicine? They need to know if you have any of these conditions: -kidney disease -suicidal thoughts, plans, or attempt; a previous suicide attempt by you or a family member -an unusual or allergic reaction to gabapentin, other medicines, foods, dyes, or preservatives -pregnant or trying to get pregnant -breast-feeding How should I use this medicine? Take this medicine by mouth with a glass of water. Follow the directions on the prescription label. You can take it with or without food. If it upsets your stomach, take it with food.Take your medicine at regular intervals. Do not take it more often than directed. Do not stop taking except on your doctor's advice. If you are directed to break the 600 or 800 mg tablets in half as part of your dose, the extra half tablet should be used for the next dose. If you have not used the extra half tablet within 28 days, it should be thrown away. A special MedGuide will be given to you by the pharmacist with each prescription and refill. Be sure to read this information carefully each time. Talk to your pediatrician regarding the use of this medicine in children. Special care may be needed. Overdosage: If you think you have taken too much of this medicine contact a poison control center or emergency room at once. NOTE: This medicine is only for you. Do not share this medicine with others. What if I miss a dose? If you miss a dose, take it as soon as you can. If it is almost time for your next dose, take only that dose. Do not  take double or extra doses. What may interact with this medicine? Do not take this medicine with any of the following medications: -other gabapentin products This medicine may also interact with the following medications: -alcohol -antacids -antihistamines for allergy, cough and cold -certain medicines for anxiety or sleep -certain medicines for depression or psychotic disturbances -homatropine; hydrocodone -naproxen -narcotic medicines (opiates) for pain -phenothiazines like chlorpromazine, mesoridazine, prochlorperazine, thioridazine This list may not describe all possible interactions. Give your health care provider a list of all the medicines, herbs, non-prescription drugs, or dietary supplements you use. Also tell them if you smoke, drink alcohol, or use illegal drugs. Some items may interact with your medicine. What should I watch for while using this medicine? Visit your doctor or health care professional for regular checks on your progress. You may want to keep a record at home of how you feel your condition is responding to treatment. You may want to share this information with your doctor or health care professional at each visit. You should contact your doctor or health care professional if your seizures get worse or if you have any new types of seizures. Do not stop taking this medicine or any of your seizure medicines unless instructed by your doctor or health care professional. Stopping your medicine suddenly can increase your seizures or their severity. Wear a medical identification bracelet or chain if you are taking this medicine for seizures, and carry a card that lists all your medications. You may get drowsy, dizzy,   or have blurred vision. Do not drive, use machinery, or do anything that needs mental alertness until you know how this medicine affects you. To reduce dizzy or fainting spells, do not sit or stand up quickly, especially if you are an older patient. Alcohol can  increase drowsiness and dizziness. Avoid alcoholic drinks. Your mouth may get dry. Chewing sugarless gum or sucking hard candy, and drinking plenty of water will help. The use of this medicine may increase the chance of suicidal thoughts or actions. Pay special attention to how you are responding while on this medicine. Any worsening of mood, or thoughts of suicide or dying should be reported to your health care professional right away. Women who become pregnant while using this medicine may enroll in the [place] [employer] Antiepileptic Drug Pregnancy Registry by calling [phone]. This registry collects information about the safety of antiepileptic drug use during pregnancy. What side effects may I notice from receiving this medicine? Side effects that you should report to your doctor or health care professional as soon as possible: -allergic reactions like skin rash, itching or hives, swelling of the face, lips, or tongue -worsening of mood, thoughts or actions of suicide or dying Side effects that usually do not require medical attention (report to your doctor or health care professional if they continue or are bothersome): -constipation -difficulty walking or controlling muscle movements -dizziness -nausea -slurred speech -tiredness -tremors -weight gain This list may not describe all possible side effects. Call your doctor for medical advice about side effects. You may report side effects to FDA at 1-800-FDA-1088. Where should I keep my medicine? Keep out of reach of children. This medicine may cause accidental overdose and death if it taken by other adults, children, or pets. Mix any unused medicine with a substance like cat litter or coffee grounds. Then throw the medicine away in a sealed container like a sealed bag or a coffee can with a lid. Do not use the medicine after the expiration date. Store at room temperature between 15 and 30 degrees C (59 and 86 degrees F). NOTE: This  sheet is a summary. It may not cover all possible information. If you have questions about this medicine, talk to your doctor, pharmacist, or health care provider.  2018 Elsevier/Gold Standard (2013-03-06 15:26:50)     Buspirone tablets What is this medicine? BUSPIRONE (byoo SPYE rone) is used to treat anxiety disorders. This medicine may be used for other purposes; ask your health care provider or pharmacist if you have questions. COMMON BRAND NAME(S): BuSpar What should I tell my health care provider before I take this medicine? They need to know if you have any of these conditions: -kidney or liver disease -an unusual or allergic reaction to buspirone, other medicines, foods, dyes, or preservatives -pregnant or trying to get pregnant -breast-feeding How should I use this medicine? Take this medicine by mouth with a glass of water. Follow the directions on the prescription label. You may take this medicine with or without food. To ensure that this medicine always works the same way for you, you should take it either always with or always without food. Take your doses at regular intervals. Do not take your medicine more often than directed. Do not stop taking except on the advice of your doctor or health care professional. Talk to your pediatrician regarding the use of this medicine in children. Special care may be needed. Overdosage: If you think you have taken too much of this medicine contact a poison  control center or emergency room at once. NOTE: This medicine is only for you. Do not share this medicine with others. What if I miss a dose? If you miss a dose, take it as soon as you can. If it is almost time for your next dose, take only that dose. Do not take double or extra doses. What may interact with this medicine? Do not take this medicine with any of the following medications: -linezolid -MAOIs like Carbex, Eldepryl, Marplan, Nardil, and Parnate -methylene  blue -procarbazine This medicine may also interact with the following medications: -diazepam -digoxin -diltiazem -erythromycin -grapefruit juice -haloperidol -medicines for mental depression or mood problems -medicines for seizures like carbamazepine, phenobarbital and phenytoin -nefazodone -other medications for anxiety -rifampin -ritonavir -some antifungal medicines like itraconazole, ketoconazole, and voriconazole -verapamil -warfarin This list may not describe all possible interactions. Give your health care provider a list of all the medicines, herbs, non-prescription drugs, or dietary supplements you use. Also tell them if you smoke, drink alcohol, or use illegal drugs. Some items may interact with your medicine. What should I watch for while using this medicine? Visit your doctor or health care professional for regular checks on your progress. It may take 1 to 2 weeks before your anxiety gets better. You may get drowsy or dizzy. Do not drive, use machinery, or do anything that needs mental alertness until you know how this drug affects you. Do not stand or sit up quickly, especially if you are an older patient. This reduces the risk of dizzy or fainting spells. Alcohol can make you more drowsy and dizzy. Avoid alcoholic drinks. What side effects may I notice from receiving this medicine? Side effects that you should report to your doctor or health care professional as soon as possible: -blurred vision or other vision changes -chest pain -confusion -difficulty breathing -feelings of hostility or anger -muscle aches and pains -numbness or tingling in hands or feet -ringing in the ears -skin rash and itching -vomiting -weakness Side effects that usually do not require medical attention (report to your doctor or health care professional if they continue or are bothersome): -disturbed dreams, nightmares -headache -nausea -restlessness or nervousness -sore throat and nasal  congestion -stomach upset This list may not describe all possible side effects. Call your doctor for medical advice about side effects. You may report side effects to FDA at 1-800-FDA-1088. Where should I keep my medicine? Keep out of the reach of children. Store at room temperature below 30 degrees C (86 degrees F). Protect from light. Keep container tightly closed. Throw away any unused medicine after the expiration date. NOTE: This sheet is a summary. It may not cover all possible information. If you have questions about this medicine, talk to your doctor, pharmacist, or health care provider.  2018 Elsevier/Gold Standard (2009-08-18 18:06:11)

## 2017-10-25 NOTE — Progress Notes (Signed)
Follow Up  Subjective:    Patient ID: Jessica Mathews, female    DOB: Dec 15, 1970, 47 y.o.   MRN: [id number]    Chief Complaint  Patient presents with  . Establish Care  . Hospitalization Follow-up   HPI  Jessica Mathews is a 47 year old female with a past medical history of Diverticulitis, IBS, Migraine, Chronic Back Pain, Depression, Anxiety,  Asthma. She is here today for follow up.  Current Status: Since her last ED visit, she continues to have increased stress and anxiety, r/t a few recent deaths in the family. She denies suicidal ideations, homicidal ideations, or auditory hallucinations. She has had multiple ED visits within the last year. She was previously followed by a Psychiatrist years ago for Anxiety and Depression. The currently takes Bentyl for abdominal discomfort from IBS. Gabapentin is taken for Chronic back pain.   She denies fevers, chills, fatigue, recent infections, weight loss, and night sweats. She has not had any headaches, visual changes, dizziness, and falls. No chest pain, heart palpitations, cough and shortness of breath reported. No reports of GI problems such as nausea, vomiting, diarrhea, and constipation. She has no reports of blood in stools, dysuria and hematuria.  Past Medical History:  Diagnosis Date  . Anxiety   . Asthma   . Chronic back pain   . Depression   . Diverticulitis   . IBS (irritable bowel syndrome)   . Insomnia   . Migraines     Family History  Problem Relation Age of Onset  . Diabetes Mother   . Hypertension Mother   . Hypertension Father   . Diabetes Father     Social History   Socioeconomic History  . Marital status: Single    Spouse name: Not on file  . Number of children: Not on file  . Years of education: Not on file  . Highest education level: Not on file  Occupational History  . Not on file  Social Needs  . Financial resource strain: Not on file  . Food insecurity:    Worry: Not on file    Inability: Not on file   . Transportation needs:    Medical: Not on file    Non-medical: Not on file  Tobacco Use  . Smoking status: Current Every Day Smoker    Packs/day: 0.50    Types: Cigarettes  . Smokeless tobacco: Never Used  Substance and Sexual Activity  . Alcohol use: No  . Drug use: No  . Sexual activity: Not on file  Lifestyle  . Physical activity:    Days per week: Not on file    Minutes per session: Not on file  . Stress: Not on file  Relationships  . Social connections:    Talks on phone: Not on file    Gets together: Not on file    Attends religious service: Not on file    Active member of club or organization: Not on file    Attends meetings of clubs or organizations: Not on file    Relationship status: Not on file  . Intimate partner violence:    Fear of current or ex partner: Not on file    Emotionally abused: Not on file    Physically abused: Not on file    Forced sexual activity: Not on file  Other Topics Concern  . Not on file  Social History Narrative  . Not on file    Past Surgical History:  Procedure Laterality Date  . ABDOMINAL  HYSTERECTOMY      Immunization History  Administered Date(s) Administered  . Influenza,inj,Quad PF,6+ Mos 10/25/2017  . Tdap 10/25/2017    Current Meds  Medication Sig  . dicyclomine (BENTYL) 20 MG tablet Take 1 tablet (20 mg total) by mouth 3 (three) times daily as needed for spasms.  . [DISCONTINUED] dicyclomine (BENTYL) 20 MG tablet Take 1 tablet (20 mg total) by mouth 3 (three) times daily as needed for spasms.    Allergies  Allergen Reactions  . Zithromax [Azithromycin] Hives    BP 122/80 (BP Location: Left Arm, Patient Position: Sitting, Cuff Size: Small)   Pulse 88   Temp 98.3 F (36.8 C) (Oral)   Ht 5\' 5"  (1.651 m)   Wt 174 lb (78.9 kg)   SpO2 95%   BMI 28.96 kg/m    Review of Systems  Constitutional: Negative.   Respiratory: Negative.   Cardiovascular: Negative.   Gastrointestinal: Positive for abdominal pain  (R/t: IBS and Diverticulitis).  Genitourinary: Negative.   Musculoskeletal: Positive for back pain (Chronic).  Skin: Negative.   Psychiatric/Behavioral: The patient is nervous/anxious.    Objective:   Physical Exam  Constitutional: She is oriented to person, place, and time. She appears well-developed and well-nourished.  Neck: Normal range of motion. Neck supple.  Cardiovascular: Normal rate, regular rhythm, normal heart sounds and intact distal pulses.  Pulmonary/Chest: Effort normal and breath sounds normal.  Abdominal: Soft. Bowel sounds are normal.  Musculoskeletal:  Chronic back pain  Neurological: She is alert and oriented to person, place, and time.  Skin: Skin is warm and dry.  Psychiatric: Judgment and thought content normal.  Anxious, Tearful   Nursing note and vitals reviewed.  Assessment & Plan:   1. Chronic bilateral low back pain with right-sided sciatica We will increase Gabapentin to 200 mg TID.  - gabapentin (NEURONTIN) 100 MG capsule; Take 2 capsules 3 times a day.  Dispense: 90 capsule; Refill: 3 - dicyclomine (BENTYL) 20 MG tablet; Take 1 tablet (20 mg total) by mouth 3 (three) times daily as needed for spasms.  Dispense: 90 tablet; Refill: 1  2. Anxiety We will initiate Buspar today for generalized anxiety. Offers efficacy as Benzodiazepines without the risk of psychologic dependence. Can cause insomnia, agitation, and nausea. Evaluate at next office visit.  - busPIRone (BUSPAR) 5 MG tablet; Take 1 tablet (5 mg total) by mouth 2 (two) times daily.  Dispense: 60 tablet; Refill: 1 - Ambulatory referral to Physical Therapy  3. Insomnia, unspecified type We will intiated - traZODone (DESYREL) 50 MG tablet; Take 0.5-1 tablets (25-50 mg total) by mouth at bedtime as needed for sleep.  Dispense: 30 tablet; Refill: 3  4. Diverticulitis Continue Bentyl as prescribed.  - dicyclomine (BENTYL) 20 MG tablet; Take 1 tablet (20 mg total) by mouth 3 (three) times daily as  needed for spasms.  Dispense: 90 tablet; Refill: 1  5. Edema, unspecified type 1+ lower extremity edema. We will initiate Lasix today.  - furosemide (LASIX) 20 MG tablet; Take 1 tablet (20 mg total) by mouth daily.  Dispense: 30 tablet; Refill: 1  6. Urinary tract infection without hematuria, site unspecified - sulfamethoxazole-trimethoprim (BACTRIM DS,SEPTRA DS) 800-160 MG tablet; Take 1 tablet by mouth 2 (two) times daily.  Dispense: 14 tablet; Refill: 0  7. Yeast infection States that she get yeast infection after taking after taking antibiotic.  - fluconazole (DIFLUCAN) 150 MG tablet; Take 1 tablet (150 mg total) by mouth once for 1 dose.  Dispense: 1  tablet; Refill: 0  8. Screening for diabetes mellitus Hgb A1c is within normal range at 5.4 today. She will continue to decrease foods/beverages high in sugars and carbs and follow Heart Healthy or DASH diet. Increase physical activity to at least 30 minutes cardio exercise daily.  - POCT glycosylated hemoglobin (Hb A1C) - POCT urinalysis dipstick  9. Need for immunization against influenza - Flu Vaccine QUAD 36+ mos IM  10. Need for Tdap vaccination - Tdap vaccine greater than or equal to 7yo IM  11. Follow up She will follow up in 1 month.   Meds ordered this encounter  Medications  . DISCONTD: gabapentin (NEURONTIN) 100 MG capsule    Sig: Take 1 capsule (100 mg total) by mouth 3 (three) times daily.    Dispense:  90 capsule    Refill:  2  . traZODone (DESYREL) 50 MG tablet    Sig: Take 0.5-1 tablets (25-50 mg total) by mouth at bedtime as needed for sleep.    Dispense:  30 tablet    Refill:  3  . busPIRone (BUSPAR) 5 MG tablet    Sig: Take 1 tablet (5 mg total) by mouth 2 (two) times daily.    Dispense:  60 tablet    Refill:  1  . gabapentin (NEURONTIN) 100 MG capsule    Sig: Take 2 capsules 3 times a day.    Dispense:  90 capsule    Refill:  3  . dicyclomine (BENTYL) 20 MG tablet    Sig: Take 1 tablet (20 mg total)  by mouth 3 (three) times daily as needed for spasms.    Dispense:  90 tablet    Refill:  1  . furosemide (LASIX) 20 MG tablet    Sig: Take 1 tablet (20 mg total) by mouth daily.    Dispense:  30 tablet    Refill:  1  . sulfamethoxazole-trimethoprim (BACTRIM DS,SEPTRA DS) 800-160 MG tablet    Sig: Take 1 tablet by mouth 2 (two) times daily.    Dispense:  14 tablet    Refill:  0  . fluconazole (DIFLUCAN) 150 MG tablet    Sig: Take 1 tablet (150 mg total) by mouth once for 1 dose.    Dispense:  1 tablet    Refill:  0    Referral Orders     Ambulatory referral to Physical Therapy    Raliegh Ip,  MSN, FNP-C [facility] [address] [place], [place] [address] [phone]

## 2017-10-25 NOTE — Addendum Note (Signed)
Addended by: Kallie Locks on: 10/25/2017 09:28 PM   Modules accepted: Level of Service

## 2017-11-26 ENCOUNTER — Ambulatory Visit: Payer: Self-pay | Admitting: Family Medicine

## 2017-12-24 MED FILL — GABAPENTIN 100 MG CAPSULE: 100 | 15 days supply | Qty: 90 | Fill #1

## 2017-12-24 MED FILL — busPIRone HCL 5 MG TABS: 5 | 30 days supply | Qty: 60 | Fill #1

## 2017-12-24 MED FILL — FUROSEMIDE 20 MG TABLET: 20 | 30 days supply | Qty: 30 | Fill #1

## 2018-10-07 ENCOUNTER — Other Ambulatory Visit: Payer: Self-pay

## 2018-10-07 ENCOUNTER — Emergency Department
Admission: EM | Admit: 2018-10-07 | Discharge: 2018-10-07 | Disposition: A | Discharge status: Home / Self Care | Payer: Self-pay | Attending: Emergency Medicine | Admitting: Emergency Medicine

## 2018-10-07 DIAGNOSIS — J45909 Unspecified asthma, uncomplicated: Secondary | ICD-10-CM | POA: Insufficient documentation

## 2018-10-07 DIAGNOSIS — K0889 Other specified disorders of teeth and supporting structures: Secondary | ICD-10-CM | POA: Insufficient documentation

## 2018-10-07 DIAGNOSIS — F17210 Nicotine dependence, cigarettes, uncomplicated: Secondary | ICD-10-CM | POA: Insufficient documentation

## 2018-10-07 DIAGNOSIS — Z79899 Other long term (current) drug therapy: Secondary | ICD-10-CM | POA: Insufficient documentation

## 2018-10-07 DIAGNOSIS — K029 Dental caries, unspecified: Secondary | ICD-10-CM | POA: Insufficient documentation

## 2018-10-07 MED ORDER — LIDOCAINE VISCOUS HCL 2 % MT SOLN
15.0000 mL | Freq: Once | OROMUCOSAL | Status: AC
  Administered 2018-10-07: 15 mL via OROMUCOSAL
  Filled 2018-10-07: qty 15

## 2018-10-07 MED ORDER — IBUPROFEN 600 MG PO TABS: 600.0000 mg | ORAL_TABLET | Freq: Four times a day (QID) | ORAL | 0 refills | Status: DC | PRN

## 2018-10-07 MED ORDER — IBUPROFEN 600 MG PO TABS
600.0000 mg | ORAL_TABLET | Freq: Once | ORAL | Status: AC
  Administered 2018-10-07: 03:00:00 600 mg via ORAL
  Filled 2018-10-07: qty 1

## 2018-10-07 MED ORDER — LIDOCAINE VISCOUS HCL 2 % MT SOLN: 15.0000 mL | OROMUCOSAL | 0 refills | Status: DC | PRN

## 2018-10-07 NOTE — ED Triage Notes (Signed)
Pt with co toothache since yesterday

## 2018-10-07 NOTE — Discharge Instructions (Signed)
OPTIONS FOR DENTAL FOLLOW UP CARE ° °[place] Department of Health and Human Services - Local Safety Net Dental Clinics °http://www.ncdhhs.gov/dph/oralhealth/services/safetynetclinics.htm °  °[facility] (336-562-3123) ° °[facility] [place] (919-933-9087) ° °[facility] (919-663-1744 ext [phone]) ° °[facility] ([phone]) ° °[facility] ([phone]) °This clinic caters to the indigent population and is on a lottery system. °Location: °[facility], [address], [place] °Clinic Hours: °Wednesdays from 6pm - 9pm, patients seen by a lottery system. °For dates, call or go to [email or web address].edu/[facility]/patients/Dental-[facility] °Services: °Cleanings, fillings and simple extractions. °Payment Options: °DENTAL WORK IS FREE OF CHARGE. Bring proof of income or support. °Best way to get seen: °Arrive at 5:15 pm - this is a lottery, NOT first come/first serve, so arriving earlier will not increase your chances of being seen. °  °  °[facility] °[phone] °Select option 1 for emergencies °  °Location: °[facility], [address], [place] °Clinic Hours: °No walk-ins accepted - call the day before to schedule an appointment. °Check in times are 9:30 am and 1:30 pm. °Services: °Simple extractions, temporary fillings, pulpectomy/pulp debridement, uncomplicated abscess drainage. °Payment Options: °PAYMENT IS DUE AT THE TIME OF SERVICE.  Fee is usually $100-200, additional surgical procedures (e.g. abscess drainage) may be extra. °Cash, checks, Visa/MasterCard accepted.  Can file Medicaid if patient is covered for dental - patient should call case worker to check. °No discount for [facility] patients. °Best way to get seen: °MUST call the day before and get onto the schedule. Can usually be seen the next 1-2 days. No walk-ins accepted. °  °  °[facility] °[phone] °   °Location: °[facility], [address], [place] °Clinic Hours: °M, W, Th, F 8am or 1:30pm, Tues 9a or 1:30 - first come/first served. °Services: °Simple extractions, temporary fillings, uncomplicated abscess drainage.  You do not need to be an [place] resident. °Payment Options: °PAYMENT IS DUE AT THE TIME OF SERVICE. °Dental insurance, otherwise sliding scale - bring proof of income or support. °Depending on income and treatment needed, cost is usually $50-200. °Best way to get seen: °Arrive early as it is first come/first served. °  °  °[facility] °[phone] °  °Location: °[address] °Clinic Hours: °Mon-Thu 8a-5p °Services: °Most basic dental services including extractions and fillings. °Payment Options: °PAYMENT IS DUE AT THE TIME OF SERVICE. °Sliding scale, up to 50% off - bring proof if income or support. °Medicaid with dental option accepted. °Best way to get seen: °Call to schedule an appointment, can usually be seen within 2 weeks OR they will try to see walk-ins - show up at 8a or 2p (you may have to wait). °  °  °[facility] °[phone] °[place] RESIDENTS ONLY °  °Location: °[facility], [address], [place], [place] [address] °Clinic Hours: By appointment only. °Monday - Thursday 8am-5pm, Friday 8am-12pm °Services: Cleanings, fillings, extractions. °Payment Options: °PAYMENT IS DUE AT THE TIME OF SERVICE. °Cash, Visa or MasterCard. Sliding scale - $30 minimum per service. °Best way to get seen: °Come in to office, complete packet and make an appointment - need proof of income °or support monies for each household member and proof of [place] residence. °Usually takes about a month to get in. °  °  °[facility] °[phone] °  °Location: °[address]   [place] °Clinic Hours: Walk-in Urgent Care Dental Services are offered Monday-Friday  mornings only. °The numbers of emergencies accepted daily is limited to the number of °providers available. °Maximum 15 - Mondays, Wednesdays & Thursdays °Maximum 10 - Tuesdays & Fridays °Services: °You do not need to be a [place] resident to be seen for a dental emergency. °Emergencies are defined as pain, swelling, abnormal bleeding, or dental trauma. Walkins will receive x-rays if needed. °NOTE: Dental cleaning is not an emergency. °Payment Options: °PAYMENT IS DUE AT THE TIME OF SERVICE. °Minimum co-pay is $40.00 for uninsured patients. °Minimum co-pay is $3.00 for Medicaid with dental coverage. °Dental Insurance is accepted and must be presented at time of visit. °Medicare does not cover dental. °Forms of payment: Cash, credit card, checks. °Best way to get seen: °If not previously registered with the clinic, walk-in dental registration begins at 7:15 am and is on a first come/first serve basis. °If previously registered with the clinic, call to make an appointment. °  °  °[facility] °[phone] °[place] RESIDENTS ONLY °  °Location: °[address], [place], [place] °Clinic Hours: °Mon-Thu 10a-2p °Services: Extractions only! °Payment Options: °FREE (donations accepted) - bring proof of income or support °Best way to get seen: °Call and schedule an appointment OR come at 8am on the 1st Monday of every month (except for holidays) when it is first come/first served. °  °  °[facility] °[phone] °  °Location: °[address], [place] °Clinic Hours: °Friday mornings °Services, Payment Options, Best way to get seen: °Call for info °

## 2018-10-07 NOTE — ED Provider Notes (Signed)
[facility] Emergency Department Provider Note  ____________________________________________  Time seen: Approximately 2:13 AM  I have reviewed the triage vital signs and the nursing notes.   HISTORY  Chief Complaint Dental Pain   HPI Jessica Mathews is a 48 y.o. female who presents for evaluation of dental pain.  Patient has a lose left lower molar for several weeks.  Earlier today the pain started to become more severe.  She reports the pain is sharp, constant, severe, worse when she tries to eat or when she moves the tooth.  The pain radiates to her ear.  She has not called her dentist.   Past Medical History:  Diagnosis Date  . Anxiety   . Asthma   . Chronic back pain   . Depression   . Diverticulitis   . IBS (irritable bowel syndrome)   . Insomnia   . Migraines     There are no active problems to display for this patient.   Past Surgical History:  Procedure Laterality Date  . ABDOMINAL HYSTERECTOMY      Prior to Admission medications   Medication Sig Start Date End Date Taking? Authorizing Provider  busPIRone (BUSPAR) 5 MG tablet Take 1 tablet (5 mg total) by mouth 2 (two) times daily. 10/25/17   Azzie Glatter, FNP  dicyclomine (BENTYL) 20 MG tablet Take 1 tablet (20 mg total) by mouth 3 (three) times daily as needed for spasms. 10/25/17 10/25/18  Azzie Glatter, FNP  furosemide (LASIX) 20 MG tablet Take 1 tablet (20 mg total) by mouth daily. 10/25/17 12/24/17  Azzie Glatter, FNP  gabapentin (NEURONTIN) 100 MG capsule Take 2 capsules 3 times a day. 10/25/17   Azzie Glatter, FNP  ibuprofen (ADVIL) 600 MG tablet Take 1 tablet (600 mg total) by mouth every 6 (six) hours as needed. 10/07/18   Alfred Levins, [place], MD  lidocaine (XYLOCAINE) 2 % solution Use as directed 15 mLs in the mouth or throat as needed for mouth pain. 10/07/18   Rudene Re, MD  LORazepam (ATIVAN) 1 MG tablet Take 1 tablet (1 mg total) by mouth 3 (three) times  daily as needed for anxiety. Patient not taking: Reported on 10/25/2017 10/18/17   Larene Pickett, PA-C  sulfamethoxazole-trimethoprim (BACTRIM DS,SEPTRA DS) 800-160 MG tablet Take 1 tablet by mouth 2 (two) times daily. 10/25/17   Azzie Glatter, FNP  traZODone (DESYREL) 50 MG tablet Take 0.5-1 tablets (25-50 mg total) by mouth at bedtime as needed for sleep. 10/25/17   Azzie Glatter, FNP    Allergies Zithromax [azithromycin]  Family History  Problem Relation Age of Onset  . Diabetes Mother   . Hypertension Mother   . Hypertension Father   . Diabetes Father     Social History Social History   Tobacco Use  . Smoking status: Current Every Day Smoker    Packs/day: 0.50    Types: Cigarettes  . Smokeless tobacco: Never Used  Substance Use Topics  . Alcohol use: No  . Drug use: No    Review of Systems  Constitutional: Negative for fever. Eyes: Negative for visual changes. ENT: Negative for sore throat. + dental pain Neck: No neck pain  Cardiovascular: Negative for chest pain. Respiratory: Negative for shortness of breath. Gastrointestinal: Negative for abdominal pain, vomiting or diarrhea. Genitourinary: Negative for dysuria. Musculoskeletal: Negative for back pain. Skin: Negative for rash. Neurological: Negative for headaches, weakness or numbness. Psych: No SI or HI  ____________________________________________   PHYSICAL  EXAM:  VITAL SIGNS: ED Triage Vitals [10/07/18 0054]  Enc Vitals Group     BP (!) 154/78     Pulse Rate 89     Resp 20     Temp 98.9 F (37.2 C)     Temp Source Oral     SpO2 97 %     Weight 162 lb (73.5 kg)     Height 5\' 5"  (1.651 m)     Head Circumference      Peak Flow      Pain Score 9     Pain Loc      Pain Edu?      Excl. in GC?     Constitutional: Alert and oriented. Well appearing and in no apparent distress. HEENT:      Head: Normocephalic and atraumatic.         Eyes: Conjunctivae are normal. Sclera is non-icteric.        Mouth/Throat: Mucous membranes are moist.  Extremely poor dentition with several cavities, several broken teeth, several missing teeth, the left lower first molar is loose which is the source of patient's pain.  There is no surrounding abscess, the gum looks normal, there is no facial swelling.  There is no trismus, floor of the mouth is soft, there is no erythema or swelling of the neck.      Neck: Supple with no signs of meningismus. Cardiovascular: Regular rate and rhythm.  Respiratory: Normal respiratory effort.  Neurologic: Normal speech and language. Face is symmetric. Moving all extremities. No gross focal neurologic deficits are appreciated. Skin: Skin is warm, dry and intact. No rash noted. Psychiatric: Mood and affect are normal. Speech and behavior are normal.  ____________________________________________   LABS (all labs ordered are listed, but only abnormal results are displayed)  Labs Reviewed - No data to display ____________________________________________  EKG  none  ____________________________________________  RADIOLOGY  none  ____________________________________________   PROCEDURES  Procedure(s) performed: None Procedures Critical Care performed:  None ____________________________________________   INITIAL IMPRESSION / ASSESSMENT AND PLAN / ED COURSE  48 y.o. female who presents for evaluation of dental pain.  Patient with poor dentition, several cavities, loose teeth, and missing teeth.  No evidence of abscess, gum disease, Ludewig's angina.  Patient was given ibuprofen 600 mg and viscous lidocaine for pain control.  She was provided with a list of dental options in the region and recommended to follow-up in the morning with a dentist.  Discussed my standard return precautions.       As part of my medical decision making, I reviewed the following data within the electronic MEDICAL RECORD NUMBER Nursing notes reviewed and incorporated, Old chart  reviewed, Notes from prior ED visits and [place] Controlled Substance Database   Patient was evaluated in Emergency Department today for the symptoms described in the history of present illness. Patient was evaluated in the context of the global COVID-19 pandemic, which necessitated consideration that the patient might be at risk for infection with the SARS-CoV-2 virus that causes COVID-19. Institutional protocols and algorithms that pertain to the evaluation of patients at risk for COVID-19 are in a state of rapid change based on information released by regulatory bodies including the CDC and federal and state organizations. These policies and algorithms were followed during the patient's care in the ED.   ____________________________________________   FINAL CLINICAL IMPRESSION(S) / ED DIAGNOSES   Final diagnoses:  Pain, dental  Dental caries      NEW MEDICATIONS STARTED DURING  THIS VISIT:  ED Discharge Orders         Ordered    lidocaine (XYLOCAINE) 2 % solution  As needed     10/07/18 0213    ibuprofen (ADVIL) 600 MG tablet  Every 6 hours PRN,   Status:  Discontinued     10/07/18 0213    ibuprofen (ADVIL) 600 MG tablet  Every 6 hours PRN     10/07/18 6195           Note:  This document was prepared using Dragon voice recognition software and may include unintentional dictation errors.    Don Perking, [place], MD 10/07/18 [phone]

## 2018-12-27 ENCOUNTER — Emergency Department: Payer: Self-pay

## 2018-12-27 ENCOUNTER — Emergency Department
Admission: EM | Admit: 2018-12-27 | Discharge: 2018-12-28 | Disposition: A | Discharge status: Home / Self Care | Payer: Self-pay | Attending: Emergency Medicine | Admitting: Emergency Medicine

## 2018-12-27 ENCOUNTER — Other Ambulatory Visit: Payer: Self-pay

## 2018-12-27 DIAGNOSIS — Y939 Activity, unspecified: Secondary | ICD-10-CM | POA: Insufficient documentation

## 2018-12-27 DIAGNOSIS — S40012A Contusion of left shoulder, initial encounter: Secondary | ICD-10-CM

## 2018-12-27 DIAGNOSIS — W19XXXA Unspecified fall, initial encounter: Secondary | ICD-10-CM

## 2018-12-27 DIAGNOSIS — R0789 Other chest pain: Secondary | ICD-10-CM

## 2018-12-27 DIAGNOSIS — S139XXA Sprain of joints and ligaments of unspecified parts of neck, initial encounter: Secondary | ICD-10-CM

## 2018-12-27 DIAGNOSIS — J45909 Unspecified asthma, uncomplicated: Secondary | ICD-10-CM | POA: Insufficient documentation

## 2018-12-27 DIAGNOSIS — R0602 Shortness of breath: Secondary | ICD-10-CM | POA: Insufficient documentation

## 2018-12-27 DIAGNOSIS — F17210 Nicotine dependence, cigarettes, uncomplicated: Secondary | ICD-10-CM | POA: Insufficient documentation

## 2018-12-27 DIAGNOSIS — Z79899 Other long term (current) drug therapy: Secondary | ICD-10-CM | POA: Insufficient documentation

## 2018-12-27 DIAGNOSIS — Y929 Unspecified place or not applicable: Secondary | ICD-10-CM | POA: Insufficient documentation

## 2018-12-27 DIAGNOSIS — Y999 Unspecified external cause status: Secondary | ICD-10-CM | POA: Insufficient documentation

## 2018-12-27 MED ORDER — OXYCODONE HCL 5 MG PO TABS
5.0000 mg | ORAL_TABLET | Freq: Once | ORAL | Status: AC
  Administered 2018-12-27: 5 mg via ORAL
  Filled 2018-12-27: qty 1

## 2018-12-27 MED ORDER — ACETAMINOPHEN 500 MG PO TABS
1000.0000 mg | ORAL_TABLET | Freq: Once | ORAL | Status: AC
  Administered 2018-12-27: 1000 mg via ORAL
  Filled 2018-12-27: qty 2

## 2018-12-27 NOTE — ED Triage Notes (Signed)
Pt arrives POV with cc of shob related to L ribs hurting from fall. Pain 10/10 radiating into neck and shoulder.  O2 sats 98% ra in lobby.

## 2018-12-27 NOTE — ED Provider Notes (Signed)
[facility] Emergency Department Provider Note  ____________________________________________  Time seen: Approximately 11:50 PM  I have reviewed the triage vital signs and the nursing notes.   HISTORY  Chief Complaint Shortness of Breath   HPI Jessica Mathews is a 48 y.o. female with history of chronic back pain, IBS, migraines, depression, anxiety who presents for evaluation after a fall.  Fall happened just prior to arrival.  Patient reports that she was turning and then noticed that she was standing too close to the heater.  She tripped over it and fell onto her left side.  She is complaining of severe left-sided chest pain that is worse with deep inspiration.  She is also complaining of left shoulder pain and left-sided neck pain.  She did hit her head on the floor.  No LOC, she is not on blood thinners.  She reports that her pain is 10 out of 10.       Past Medical History:  Diagnosis Date   Anxiety    Asthma    Chronic back pain    Depression    Diverticulitis    IBS (irritable bowel syndrome)    Insomnia    Migraines     There are no active problems to display for this patient.   Past Surgical History:  Procedure Laterality Date   ABDOMINAL HYSTERECTOMY      Prior to Admission medications   Medication Sig Start Date End Date Taking? Authorizing Provider  busPIRone (BUSPAR) 5 MG tablet Take 1 tablet (5 mg total) by mouth 2 (two) times daily. 10/25/17   Kallie Locks, FNP  cyclobenzaprine (FLEXERIL) 10 MG tablet Take 1 tablet (10 mg total) by mouth 3 (three) times daily as needed for muscle spasms. 12/28/18   Nita Sickle, MD  dicyclomine (BENTYL) 20 MG tablet Take 1 tablet (20 mg total) by mouth 3 (three) times daily as needed for spasms. 10/25/17 10/25/18  Kallie Locks, FNP  furosemide (LASIX) 20 MG tablet Take 1 tablet (20 mg total) by mouth daily. 10/25/17 12/24/17  Kallie Locks, FNP  gabapentin (NEURONTIN) 100 MG  capsule Take 2 capsules 3 times a day. 10/25/17   Kallie Locks, FNP  ibuprofen (ADVIL) 600 MG tablet Take 1 tablet (600 mg total) by mouth every 6 (six) hours as needed. 10/07/18   Don Perking, [place], MD  lidocaine (XYLOCAINE) 2 % solution Use as directed 15 mLs in the mouth or throat as needed for mouth pain. 10/07/18   Nita Sickle, MD  LORazepam (ATIVAN) 1 MG tablet Take 1 tablet (1 mg total) by mouth 3 (three) times daily as needed for anxiety. Patient not taking: Reported on 10/25/2017 10/18/17   Garlon Hatchet, PA-C  oxyCODONE-acetaminophen (PERCOCET) 5-325 MG tablet Take 1 tablet by mouth every 4 (four) hours as needed. 12/28/18   Nita Sickle, MD  sulfamethoxazole-trimethoprim (BACTRIM DS,SEPTRA DS) 800-160 MG tablet Take 1 tablet by mouth 2 (two) times daily. 10/25/17   Kallie Locks, FNP  traZODone (DESYREL) 50 MG tablet Take 0.5-1 tablets (25-50 mg total) by mouth at bedtime as needed for sleep. 10/25/17   Kallie Locks, FNP    Allergies Zithromax [azithromycin]  Family History  Problem Relation Age of Onset   Diabetes Mother    Hypertension Mother    Hypertension Father    Diabetes Father     Social History Social History   Tobacco Use   Smoking status: Current Every Day Smoker  Packs/day: 0.50    Types: Cigarettes   Smokeless tobacco: Never Used  Substance Use Topics   Alcohol use: No   Drug use: No    Review of Systems  Constitutional: Negative for fever. Eyes: Negative for visual changes. ENT: Negative for facial injury. + L neck pain Cardiovascular: Negative for chest injury. Respiratory: Negative for shortness of breath. + L chest wall pain. Gastrointestinal: Negative for abdominal pain or injury. Genitourinary: Negative for dysuria. Musculoskeletal: Negative for back injury, + L shoulder pain Skin: Negative for laceration/abrasions. Neurological: Negative for head  injury.   ____________________________________________   PHYSICAL EXAM:  VITAL SIGNS: ED Triage Vitals  Enc Vitals Group     BP 12/27/18 2319 (!) 144/104     Pulse Rate 12/27/18 2319 86     Resp 12/27/18 2319 20     Temp 12/27/18 2319 98.2 F (36.8 C)     Temp Source 12/27/18 2319 Oral     SpO2 12/27/18 2319 94 %     Weight 12/27/18 2328 162 lb (73.5 kg)     Height 12/27/18 2328 5\' 5"  (1.651 m)     Head Circumference --      Peak Flow --      Pain Score 12/27/18 2327 10     Pain Loc --      Pain Edu? --      Excl. in GC? --     Full spinal precautions maintained throughout the trauma exam. Constitutional: Alert and oriented. Crying and screaming in pain. HEENT Head: Normocephalic and atraumatic. Face: No facial bony tenderness. Stable midface Ears: No hemotympanum bilaterally. No Battle sign Eyes: No eye injury. PERRL. No raccoon eyes Nose: Nontender. No epistaxis. No rhinorrhea Mouth/Throat: Mucous membranes are moist. No oropharyngeal blood. No dental injury. Airway patent without stridor. Normal voice. Neck: no C-collar. No midline c-spine tenderness. L paraspinal tenderness Cardiovascular: Normal rate, regular rhythm. Normal and symmetric distal pulses are present in all extremities. Pulmonary/Chest: Chest wall is stable with diffuse tenderness to palpation over the L side, no brusies or deformities. Normal respiratory effort. Breath sounds are normal. No crepitus.  Abdominal: Soft, nontender, non distended. Musculoskeletal: tender to palpation of the L shoulder with no deformity. Nontender with normal full range of motion in all other extremities. No deformities. No thoracic or lumbar midline spinal tenderness. Pelvis is stable. Skin: Skin is warm, dry and intact. No abrasions or contutions. Psychiatric: Speech and behavior are appropriate. Neurological: Normal speech and language. Moves all extremities to command. No gross focal neurologic deficits are  appreciated.  Glascow Coma Score: 4 - Opens eyes on own 6 - Follows simple motor commands 5 - Alert and oriented GCS: 15   ____________________________________________   LABS (all labs ordered are listed, but only abnormal results are displayed)  Labs Reviewed - No data to display ____________________________________________  EKG  none  ____________________________________________  RADIOLOGY  I have personally reviewed the images performed during this visit and I agree with the Radiologist's read.   Interpretation by Radiologist:  Dg Ribs Unilateral W/chest Left  Result Date: 12/28/2018 CLINICAL DATA:  Fall and chest pain EXAM: LEFT RIBS AND CHEST - 3+ VIEW COMPARISON:  None. FINDINGS: No fracture or other bone lesions are seen involving the ribs. There is no evidence of pneumothorax or pleural effusion. Both lungs are clear. Heart size and mediastinal contours are within normal limits. IMPRESSION: Negative. Electronically Signed   By: 14/06/2018 M.D.   On: 12/28/2018 01:10   Ct  Head Wo Contrast  Result Date: 12/28/2018 CLINICAL DATA:  Fall, left rib pain and neck pain EXAM: CT HEAD WITHOUT CONTRAST TECHNIQUE: Contiguous axial images were obtained from the base of the skull through the vertex without intravenous contrast. COMPARISON:  October 18, 2017 FINDINGS: Brain: No evidence of acute territorial infarction, hemorrhage, hydrocephalus,extra-axial collection or mass lesion/mass effect. Normal gray-white differentiation. Ventricles are normal in size and contour. Vascular: No hyperdense vessel or unexpected calcification. Skull: The skull is intact. No fracture or focal lesion identified. Sinuses/Orbits: Mild mucosal thickening of the right maxillary sinus. The orbits and globes intact. Other: None Cervical spine: Alignment: Physiologic Skull base and vertebrae: Visualized skull base is intact. No atlanto-occipital dissociation. The vertebral body heights are well maintained.  No fracture or pathologic osseous lesion seen. Soft tissues and spinal canal: The visualized paraspinal soft tissues are unremarkable. No prevertebral soft tissue swelling is seen. The spinal canal is grossly unremarkable, no large epidural collection or significant canal narrowing. Disc levels: Cervical spine spondylosis most notable at C5-C6 and C6-C7 with disc osteophyte complex and uncovertebral osteophytes. There is mild canal narrowing. Upper chest: Centrilobular emphysematous changes seen at both lung apices. Thoracic inlet is within normal limits. Other: None IMPRESSION: 1. No acute intracranial abnormality. 2.  No acute fracture or malalignment of the spine. 3. Cervical spine spondylosis most notable at C5-C6 and C6-C7. 4. Extensive centrilobular emphysematous changes seen at both lung apices. Electronically Signed   By: Prudencio Pair M.D.   On: 12/28/2018 00:51   Ct Cervical Spine Wo Contrast  Result Date: 12/28/2018 CLINICAL DATA:  Fall, left rib pain and neck pain EXAM: CT HEAD WITHOUT CONTRAST TECHNIQUE: Contiguous axial images were obtained from the base of the skull through the vertex without intravenous contrast. COMPARISON:  October 18, 2017 FINDINGS: Brain: No evidence of acute territorial infarction, hemorrhage, hydrocephalus,extra-axial collection or mass lesion/mass effect. Normal gray-white differentiation. Ventricles are normal in size and contour. Vascular: No hyperdense vessel or unexpected calcification. Skull: The skull is intact. No fracture or focal lesion identified. Sinuses/Orbits: Mild mucosal thickening of the right maxillary sinus. The orbits and globes intact. Other: None Cervical spine: Alignment: Physiologic Skull base and vertebrae: Visualized skull base is intact. No atlanto-occipital dissociation. The vertebral body heights are well maintained. No fracture or pathologic osseous lesion seen. Soft tissues and spinal canal: The visualized paraspinal soft tissues are  unremarkable. No prevertebral soft tissue swelling is seen. The spinal canal is grossly unremarkable, no large epidural collection or significant canal narrowing. Disc levels: Cervical spine spondylosis most notable at C5-C6 and C6-C7 with disc osteophyte complex and uncovertebral osteophytes. There is mild canal narrowing. Upper chest: Centrilobular emphysematous changes seen at both lung apices. Thoracic inlet is within normal limits. Other: None IMPRESSION: 1. No acute intracranial abnormality. 2.  No acute fracture or malalignment of the spine. 3. Cervical spine spondylosis most notable at C5-C6 and C6-C7. 4. Extensive centrilobular emphysematous changes seen at both lung apices. Electronically Signed   By: Prudencio Pair M.D.   On: 12/28/2018 00:51   Dg Shoulder Left  Result Date: 12/28/2018 CLINICAL DATA:  Fall and chest pain EXAM: LEFT SHOULDER - 2+ VIEW COMPARISON:  None. FINDINGS: The study is limited due to technique. No definite fracture seen. No overlying soft tissue swelling. IMPRESSION: Limited exam, no definite fracture. Electronically Signed   By: Prudencio Pair M.D.   On: 12/28/2018 01:11      ____________________________________________   PROCEDURES  Procedure(s) performed: None Procedures Critical Care  performed:  None ____________________________________________   INITIAL IMPRESSION / ASSESSMENT AND PLAN / ED COURSE   48 y.o. female with history of chronic back pain, IBS, migraines, depression, anxiety who presents for evaluation of L sided chest wall, L shoulder, and L neck pain after a mechanical fall.  Patient looks extremely uncomfortable, crying and screaming in the room.  She has normal work of breathing and normal sats, she has no bruising or deformities.  Imaging pending    _________________________ 1:24 AM on 12/28/2018 -----------------------------------------  CT head and cervical spine, x-ray of the shoulder and ribs/chest with no acute findings.  Pain well  controlled on oral medications.  Will discharge home on a short course of Percocet and Flexeril.  Recommended follow-up with PCP.  Discussed my standard return precautions.    Please note:  Patient was evaluated in Emergency Department today for the symptoms described in the history of present illness. Patient was evaluated in the context of the global COVID-19 pandemic, which necessitated consideration that the patient might be at risk for infection with the SARS-CoV-2 virus that causes COVID-19. Institutional protocols and algorithms that pertain to the evaluation of patients at risk for COVID-19 are in a state of rapid change based on information released by regulatory bodies including the CDC and federal and state organizations. These policies and algorithms were followed during the patient's care in the ED.  Some ED evaluations and interventions may be delayed as a result of limited staffing during the pandemic.   As part of my medical decision making, I reviewed the following data within the electronic MEDICAL RECORD NUMBER Nursing notes reviewed and incorporated, Old chart reviewed, Radiograph reviewed , Notes from prior ED visits and [place] Controlled Substance Database   ____________________________________________   FINAL CLINICAL IMPRESSION(S) / ED DIAGNOSES   Final diagnoses:  Fall, initial encounter  Chest wall pain  Neck sprain, initial encounter  Contusion of left shoulder, initial encounter      NEW MEDICATIONS STARTED DURING THIS VISIT:  ED Discharge Orders         Ordered    oxyCODONE-acetaminophen (PERCOCET) 5-325 MG tablet  Every 4 hours PRN     12/28/18 0123    cyclobenzaprine (FLEXERIL) 10 MG tablet  3 times daily PRN     12/28/18 0123           Note:  This document was prepared using Dragon voice recognition software and may include unintentional dictation errors.    Don PerkingVeronese, [place], MD 12/28/18 [phone]

## 2018-12-28 ENCOUNTER — Emergency Department: Payer: Self-pay

## 2018-12-28 MED ORDER — OXYCODONE HCL 5 MG PO TABS
5.0000 mg | ORAL_TABLET | Freq: Once | ORAL | Status: AC
  Administered 2018-12-28: 5 mg via ORAL
  Filled 2018-12-28: qty 1

## 2018-12-28 MED ORDER — CYCLOBENZAPRINE HCL 10 MG PO TABS: 10.0000 mg | ORAL_TABLET | Freq: Three times a day (TID) | ORAL | 0 refills | Status: DC | PRN

## 2018-12-28 MED ORDER — OXYCODONE-ACETAMINOPHEN 5-325 MG PO TABS: 1.0000 | ORAL_TABLET | ORAL | 0 refills | Status: DC | PRN

## 2018-12-28 MED ORDER — CYCLOBENZAPRINE HCL 10 MG PO TABS
10.0000 mg | ORAL_TABLET | Freq: Once | ORAL | Status: AC
  Administered 2018-12-28: 10 mg via ORAL
  Filled 2018-12-28: qty 1

## 2018-12-28 NOTE — Discharge Instructions (Addendum)

## 2019-01-20 ENCOUNTER — Telehealth: Payer: Self-pay | Admitting: Family Medicine

## 2019-01-20 NOTE — Telephone Encounter (Signed)
Call Pt left message to call the office to set up a appointment

## 2020-08-14 ENCOUNTER — Other Ambulatory Visit: Payer: Self-pay

## 2020-08-14 DIAGNOSIS — F17210 Nicotine dependence, cigarettes, uncomplicated: Secondary | ICD-10-CM | POA: Insufficient documentation

## 2020-08-14 DIAGNOSIS — B9689 Other specified bacterial agents as the cause of diseases classified elsewhere: Secondary | ICD-10-CM | POA: Insufficient documentation

## 2020-08-14 DIAGNOSIS — N390 Urinary tract infection, site not specified: Secondary | ICD-10-CM | POA: Insufficient documentation

## 2020-08-14 DIAGNOSIS — J45909 Unspecified asthma, uncomplicated: Secondary | ICD-10-CM | POA: Insufficient documentation

## 2020-08-14 DIAGNOSIS — Z79899 Other long term (current) drug therapy: Secondary | ICD-10-CM | POA: Insufficient documentation

## 2020-08-14 NOTE — ED Triage Notes (Signed)
Patient reports urinary frequency, bilateral flank pain, and pressure after urination. Patient reports frequent UTIs.

## 2020-08-15 ENCOUNTER — Emergency Department: Payer: Self-pay

## 2020-08-15 ENCOUNTER — Emergency Department
Admission: EM | Admit: 2020-08-15 | Discharge: 2020-08-15 | Disposition: A | Discharge status: Home / Self Care | Payer: Self-pay | Attending: Emergency Medicine | Admitting: Emergency Medicine

## 2020-08-15 DIAGNOSIS — N390 Urinary tract infection, site not specified: Secondary | ICD-10-CM

## 2020-08-15 LAB — URINALYSIS, COMPLETE (UACMP) WITH MICROSCOPIC
Bilirubin Urine: NEGATIVE
Glucose, UA: NEGATIVE mg/dL
Hgb urine dipstick: NEGATIVE
Ketones, ur: 5 mg/dL — AB
Leukocytes,Ua: NEGATIVE
Nitrite: NEGATIVE
Protein, ur: 100 mg/dL — AB
Specific Gravity, Urine: 1.03 (ref 1.005–1.030)
pH: 5 (ref 5.0–8.0)

## 2020-08-15 LAB — BASIC METABOLIC PANEL
Anion gap: 8 (ref 5–15)
BUN: 14 mg/dL (ref 6–20)
CO2: 26 mmol/L (ref 22–32)
Calcium: 9 mg/dL (ref 8.9–10.3)
Chloride: 104 mmol/L (ref 98–111)
Creatinine, Ser: 1.04 mg/dL — ABNORMAL HIGH (ref 0.44–1.00)
GFR, Estimated: >60 mL/min (ref >60)
Glucose, Bld: 121 mg/dL — ABNORMAL HIGH (ref 70–99)
Potassium: 3.4 mmol/L — ABNORMAL LOW (ref 3.5–5.1)
Sodium: 138 mmol/L (ref 135–145)

## 2020-08-15 LAB — CBC
HCT: 41.9 % (ref 36.0–46.0)
Hemoglobin: 14 g/dL (ref 12.0–15.0)
MCH: 29.2 pg (ref 26.0–34.0)
MCHC: 33.4 g/dL (ref 30.0–36.0)
MCV: 87.3 fL (ref 80.0–100.0)
Platelets: 231 10*3/uL (ref 150–400)
RBC: 4.8 MIL/uL (ref 3.87–5.11)
RDW: 15.9 % — ABNORMAL HIGH (ref 11.5–15.5)
WBC: 8.3 10*3/uL (ref 4.0–10.5)
nRBC: 0 % (ref 0.0–0.2)

## 2020-08-15 MED ORDER — KETOROLAC TROMETHAMINE 30 MG/ML IJ SOLN
30.0000 mg | Freq: Once | INTRAMUSCULAR | Status: AC
  Administered 2020-08-15: 30 mg via INTRAMUSCULAR
  Filled 2020-08-15: qty 1

## 2020-08-15 MED ORDER — ONDANSETRON 4 MG PO TBDP
4.0000 mg | ORAL_TABLET | Freq: Once | ORAL | Status: AC
  Administered 2020-08-15: 4 mg via ORAL
  Filled 2020-08-15: qty 1

## 2020-08-15 MED ORDER — HYDROCODONE-ACETAMINOPHEN 5-325 MG PO TABS: 1.0000 | ORAL_TABLET | Freq: Four times a day (QID) | ORAL | 0 refills | Status: DC | PRN

## 2020-08-15 MED ORDER — CEPHALEXIN 500 MG PO CAPS: 500.0000 mg | ORAL_CAPSULE | Freq: Three times a day (TID) | ORAL | 0 refills | Status: DC

## 2020-08-15 MED ORDER — PHENAZOPYRIDINE HCL 100 MG PO TABS: 100.0000 mg | ORAL_TABLET | Freq: Three times a day (TID) | ORAL | 0 refills | Status: DC | PRN

## 2020-08-15 NOTE — Discharge Instructions (Addendum)
Follow-up with your primary care provider if any continued problems or concerns.  Begin taking antibiotic today 3 times a day for the next 10 days along with Pyridium 3 times daily which will turn your urine a bright orange color however this will help with frequency and burning.  Increase fluids.  A prescription for hydrocodone was written for 2 days if needed for severe pain.  No kidney stones were seen on your CT scan.

## 2020-08-15 NOTE — ED Notes (Signed)
Pt states she is going outside to use the telephone.

## 2020-08-15 NOTE — ED Notes (Signed)
See triage note  Presents with flank pain   States pain started on Friday and has gotten worse  Positive nausea

## 2020-08-15 NOTE — ED Provider Notes (Signed)
[facility] Emergency Department Provider Note  ____________________________________________   None    (approximate)  I have reviewed the triage vital signs and the nursing notes.   HISTORY  Chief Complaint Urinary Frequency   HPI Jessica Mathews is a 50 y.o. female presents to the ED with complaint of right flank pain that started approximately 4 days ago.  Patient reports nausea but no vomiting.  Patient has a history of urinary tract infections along with stones but has not had any problems in the past year.       Past Medical History:  Diagnosis Date   Anxiety    Asthma    Chronic back pain    Depression    Diverticulitis    IBS (irritable bowel syndrome)    Insomnia    Migraines     There are no problems to display for this patient.   Past Surgical History:  Procedure Laterality Date   ABDOMINAL HYSTERECTOMY      Prior to Admission medications   Medication Sig Start Date End Date Taking? Authorizing Provider  cephALEXin (KEFLEX) 500 MG capsule Take 1 capsule (500 mg total) by mouth 3 (three) times daily. 08/15/20  Yes Tommi Rumps, PA-C  HYDROcodone-acetaminophen (NORCO/VICODIN) 5-325 MG tablet Take 1 tablet by mouth every 6 (six) hours as needed for moderate pain. 08/15/20 08/15/21 Yes Tommi Rumps, PA-C  phenazopyridine (PYRIDIUM) 100 MG tablet Take 1 tablet (100 mg total) by mouth 3 (three) times daily as needed for pain. 08/15/20  Yes Bridget Hartshorn L, PA-C  busPIRone (BUSPAR) 5 MG tablet Take 1 tablet (5 mg total) by mouth 2 (two) times daily. 10/25/17   Kallie Locks, FNP  dicyclomine (BENTYL) 20 MG tablet Take 1 tablet (20 mg total) by mouth 3 (three) times daily as needed for spasms. 10/25/17 10/25/18  Kallie Locks, FNP  furosemide (LASIX) 20 MG tablet Take 1 tablet (20 mg total) by mouth daily. 10/25/17 12/24/17  Kallie Locks, FNP  gabapentin (NEURONTIN) 100 MG capsule Take 2 capsules 3 times a day. 10/25/17    Kallie Locks, FNP  traZODone (DESYREL) 50 MG tablet Take 0.5-1 tablets (25-50 mg total) by mouth at bedtime as needed for sleep. 10/25/17   Kallie Locks, FNP    Allergies Zithromax [azithromycin]  Family History  Problem Relation Age of Onset   Diabetes Mother    Hypertension Mother    Hypertension Father    Diabetes Father     Social History Social History   Tobacco Use   Smoking status: Every Day    Packs/day: 0.50    Types: Cigarettes   Smokeless tobacco: Never  Substance Use Topics   Alcohol use: No   Drug use: No    Review of Systems Constitutional: No fever/chills Eyes: No visual changes. ENT: No sore throat. Cardiovascular: Denies chest pain. Respiratory: Denies shortness of breath. Gastrointestinal: No abdominal pain.  No nausea, no vomiting.  No diarrhea.  Genitourinary: Urinary frequency, bilateral flank pain, dysuria. Musculoskeletal: Negative for back pain. Skin: Negative for rash. Neurological: Negative for headaches, focal weakness or numbness.  ____________________________________________   PHYSICAL EXAM:  VITAL SIGNS: ED Triage Vitals  Enc Vitals Group     BP 08/14/20 2350 (!) 147/85     Pulse Rate 08/14/20 2350 90     Resp 08/14/20 2350 18     Temp 08/14/20 2350 98.8 F (37.1 C)     Temp Source 08/14/20 2350 Oral  SpO2 08/14/20 2350 98 %     Weight 08/14/20 2357 163 lb 2.3 oz (74 kg)     Height 08/14/20 2357 5\' 5"  (1.651 m)     Head Circumference --      Peak Flow --      Pain Score 08/14/20 2355 9     Pain Loc --      Pain Edu? --      Excl. in GC? --     Constitutional: Alert and oriented. Well appearing and in no acute distress. Eyes: Conjunctivae are normal.  Head: Atraumatic. Nose: No congestion/rhinnorhea. Mouth/Throat: Mucous membranes are moist.   Neck: No stridor.  Cardiovascular: Normal rate, regular rhythm. Grossly normal heart sounds.  Good peripheral circulation. Respiratory: Normal respiratory effort.   No retractions. Lungs CTAB. Gastrointestinal: Soft and nontender. No distention.  No CVA tenderness.  No flank pain on exam and patient places her hands lower lumbar area.  Range of motion is without restriction or assistance. Musculoskeletal: No lower extremity tenderness nor edema.  No edema noted lower extremities. Neurologic:  Normal speech and language. No gross focal neurologic deficits are appreciated. No gait instability. Skin:  Skin is warm, dry and intact. No rash noted. Psychiatric: Mood and affect are normal. Speech and behavior are normal.  ____________________________________________   LABS (all labs ordered are listed, but only abnormal results are displayed)  Labs Reviewed  URINALYSIS, COMPLETE (UACMP) WITH MICROSCOPIC - Abnormal; Notable for the following components:      Result Value   Color, Urine YELLOW (*)    APPearance HAZY (*)    Ketones, ur 5 (*)    Protein, ur 100 (*)    Bacteria, UA RARE (*)    All other components within normal limits  BASIC METABOLIC PANEL - Abnormal; Notable for the following components:   Potassium 3.4 (*)    Glucose, Bld 121 (*)    Creatinine, Ser 1.04 (*)    All other components within normal limits  CBC - Abnormal; Notable for the following components:   RDW 15.9 (*)    All other components within normal limits  URINE CULTURE   ____________________________________________  RADIOLOGY 08/16/20, personally viewed and evaluated these images (plain radiographs) as part of my medical decision making, as well as reviewing the written report by the radiologist.   Official radiology report(s): CT Renal Stone Study  Result Date: 08/15/2020 CLINICAL DATA:  Bilateral flank pain. EXAM: CT ABDOMEN AND PELVIS WITHOUT CONTRAST TECHNIQUE: Multidetector CT imaging of the abdomen and pelvis was performed following the standard protocol without IV contrast. COMPARISON:  March 04, 2017. FINDINGS: Lower chest: No acute abnormality.  Hepatobiliary: No gallstones or biliary dilatation is noted. Stable left hepatic cyst. Pancreas: Unremarkable. No pancreatic ductal dilatation or surrounding inflammatory changes. Spleen: Normal in size without focal abnormality. Adrenals/Urinary Tract: Adrenal glands are unremarkable. Kidneys are normal, without renal calculi, focal lesion, or hydronephrosis. Bladder is unremarkable. Stomach/Bowel: Stomach is within normal limits. Appendix appears normal. No evidence of bowel wall thickening, distention, or inflammatory changes. Sigmoid diverticulosis is noted without inflammation. Vascular/Lymphatic: Aortic atherosclerosis. No enlarged abdominal or pelvic lymph nodes. Reproductive: Status post hysterectomy. No adnexal masses. Other: No abdominal wall hernia or abnormality. No abdominopelvic ascites. Musculoskeletal: No acute or significant osseous findings. IMPRESSION: No acute abnormality seen in the abdomen or pelvis. Electronically Signed   By: March 06, 2017 M.D.   On: 08/15/2020 08:13    ____________________________________________   PROCEDURES  Procedure(s) performed (including  Critical Care):  Procedures   ____________________________________________   INITIAL IMPRESSION / ASSESSMENT AND PLAN / ED COURSE  As part of my medical decision making, I reviewed the following data within the electronic MEDICAL RECORD NUMBER Notes from prior ED visits and [place] Controlled Substance Database  50 year old female presents to the ED with complaint of urinary frequency, bilateral flank pain and pressure with urination.  She denies any fever or chills at this time.  There is been no nausea or vomiting.  Patient has a history of urinary tract infections along with stones but no problems for the past year.  Urinalysis was consistent with a UTI.  No stone was first seen on CT scan.  Patient was reassured.  A prescription for cephalexin 500 mg 3 times daily for 10 days along with Pyridium 100 mg 3 times daily as  needed and Norco as needed for pain.  Patient is encouraged to drink lots of fluids.  A urine culture was ordered.  Patient is to follow-up with her PCP if any continued problems and also for recheck of her urine when she is finished the antibiotic.  ____________________________________________   FINAL CLINICAL IMPRESSION(S) / ED DIAGNOSES  Final diagnoses:  Acute urinary tract infection     ED Discharge Orders          Ordered    cephALEXin (KEFLEX) 500 MG capsule  3 times daily        08/15/20 0833    phenazopyridine (PYRIDIUM) 100 MG tablet  3 times daily PRN        08/15/20 0833    HYDROcodone-acetaminophen (NORCO/VICODIN) 5-325 MG tablet  Every 6 hours PRN        08/15/20 3013             Note:  This document was prepared using Dragon voice recognition software and may include unintentional dictation errors.    Tommi Rumps, PA-C 08/15/20 1432    Merwyn Katos, MD 08/16/20 [phone]

## 2020-08-15 NOTE — ED Notes (Signed)
Phlebotomy contacted for labs r/t no blood successful x2 attempts.

## 2020-08-16 LAB — URINE CULTURE

## 2021-05-23 ENCOUNTER — Other Ambulatory Visit: Payer: Self-pay

## 2021-05-23 ENCOUNTER — Emergency Department
Admission: EM | Admit: 2021-05-23 | Discharge: 2021-05-23 | Disposition: A | Discharge status: Home / Self Care | Payer: 59 | Attending: Emergency Medicine | Admitting: Emergency Medicine

## 2021-05-23 ENCOUNTER — Encounter: Payer: Self-pay | Admitting: Emergency Medicine

## 2021-05-23 DIAGNOSIS — R1030 Lower abdominal pain, unspecified: Secondary | ICD-10-CM | POA: Insufficient documentation

## 2021-05-23 DIAGNOSIS — R300 Dysuria: Secondary | ICD-10-CM | POA: Diagnosis not present

## 2021-05-23 DIAGNOSIS — M545 Low back pain, unspecified: Secondary | ICD-10-CM | POA: Diagnosis not present

## 2021-05-23 LAB — URINALYSIS, ROUTINE W REFLEX MICROSCOPIC
Bilirubin Urine: NEGATIVE
Glucose, UA: NEGATIVE mg/dL
Hgb urine dipstick: NEGATIVE
Ketones, ur: NEGATIVE mg/dL
Leukocytes,Ua: NEGATIVE
Nitrite: NEGATIVE
Protein, ur: NEGATIVE mg/dL
Specific Gravity, Urine: 1.02 (ref 1.005–1.030)
pH: 6 (ref 5.0–8.0)

## 2021-05-23 MED ORDER — ESTROGENS, CONJUGATED 0.625 MG/GM VA CREA: 0.5000 g | TOPICAL_CREAM | Freq: Every day | VAGINAL | 1 refills | Status: AC

## 2021-05-23 MED ORDER — CEPHALEXIN 500 MG PO CAPS: 500.0000 mg | ORAL_CAPSULE | Freq: Four times a day (QID) | ORAL | 0 refills | Status: AC

## 2021-05-23 NOTE — ED Provider Notes (Signed)
? ?[facility] ?Provider Note ? ?Patient Contact: 4:01 PM (approximate) ? ? ?History  ? ?Back Pain (/) ? ? ?HPI ? ?Jessica Mathews is a 51 y.o. female presents to the emergency department with bilateral low back pain with concern for UTI.  Patient states that she has frequent urinary tract infections.  No dysuria or increased urinary frequency.  Patient states that she does have some suprapubic pain.  No nausea or vomiting.  States that she does have a history of nephrolithiasis but she states that she knows the difference.  No evidence of pyelonephritis. ?  ? ? ?Physical Exam  ? ?Triage Vital Signs: ?ED Triage Vitals  ?Enc Vitals Group  ?   BP 05/23/21 1527 (!) 131/101  ?   Pulse Rate 05/23/21 1527 78  ?   Resp 05/23/21 1527 16  ?   Temp 05/23/21 1527 98.7 ?F (37.1 ?C)  ?   Temp Source 05/23/21 1527 Oral  ?   SpO2 05/23/21 1527 99 %  ?   Weight 05/23/21 1532 163 lb 2.3 oz (74 kg)  ?   Height 05/23/21 1532 5\' 5"  (1.651 m)  ?   Head Circumference --   ?   Peak Flow --   ?   Pain Score 05/23/21 1532 5  ?   Pain Loc --   ?   Pain Edu? --   ?   Excl. in [place]? --   ? ? ?Most recent vital signs: ?Vitals:  ? 05/23/21 1527  ?BP: (!) 131/101  ?Pulse: 78  ?Resp: 16  ?Temp: 98.7 ?F (37.1 ?C)  ?SpO2: 99%  ? ? ? ?General: Alert and in no acute distress. ?Eyes:  PERRL. EOMI. ?Head: No acute traumatic findings ?ENT: ?     Ears:  ?     Nose: No congestion/rhinnorhea. ?     Mouth/Throat: Mucous membranes are moist.  ?Neck: No stridor. No cervical spine tenderness to palpation. ?Cardiovascular:  Good peripheral perfusion ?Respiratory: Normal respiratory effort without tachypnea or retractions. Lungs CTAB. Good air entry to the bases with no decreased or absent breath sounds. ?Gastrointestinal: Bowel sounds ?4 quadrants. Soft and nontender to palpation. No guarding or rigidity. No palpable masses. No distention. No CVA tenderness. ?Musculoskeletal: Full range of motion to all extremities.  ?Neurologic:  No gross  focal neurologic deficits are appreciated.  ?Skin:   No rash noted ? ? ?ED Results / Procedures / Treatments  ? ?Labs ?(all labs ordered are listed, but only abnormal results are displayed) ?Labs Reviewed  ?URINALYSIS, ROUTINE W REFLEX MICROSCOPIC - Abnormal; Notable for the following components:  ?    Result Value  ? Color, Urine YELLOW (*)   ? APPearance CLEAR (*)   ? All other components within normal limits  ? ? ? ?PROCEDURES: ? ?Critical Care performed: No ? ?Procedures ? ? ?MEDICATIONS ORDERED IN ED: ?Medications - No data to display ? ? ?IMPRESSION / MDM / ASSESSMENT AND PLAN / ED COURSE  ?I reviewed the triage vital signs and the nursing notes. ?             ?               ?Assessment and plan: ?Dysuria:  ?Differential diagnosis includes, but is not limited to, UTI, lumbar strain ? ?51 year old female presents to the emergency department with low back pain that is nonradiating with concern for possible UTI ?  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 ? ?FINAL CLINICAL IMPRESSION(S) / ED DIAGNOSES  ? ?Final diagnoses:  ?Acute bilateral low back pain without sciatica  ? ? ? ?Rx / DC Orders  ? ?ED Discharge Orders   ? ?      Ordered  ?  cephALEXin (KEFLEX) 500 MG capsule  4 times daily       ? 05/23/21 1702  ?  conjugated estrogens (PREMARIN) 0.625 MG/GM vaginal cream  Daily       ? 05/23/21 1706  ? ?  ?  ? ?  ? ? ? ?Note:  This document was prepared using Dragon voice recognition software and may include unintentional dictation errors. ?  ?Lannie Fields, PA-C ?05/23/21 2155 ? ?  ?Lucrezia Starch, MD ?05/23/21 2217 ? ?

## 2021-05-23 NOTE — ED Triage Notes (Signed)
Presents with lower back pain  denies any injury  ambulates wel ?

## 2021-05-23 NOTE — Discharge Instructions (Addendum)
Take Keflex four times daily for the next seven days.  

## 2021-07-14 ENCOUNTER — Encounter: Payer: Self-pay | Admitting: Emergency Medicine

## 2021-07-14 ENCOUNTER — Emergency Department
Admission: EM | Admit: 2021-07-14 | Discharge: 2021-07-14 | Disposition: A | Discharge status: Home / Self Care | Payer: 59 | Attending: Emergency Medicine | Admitting: Emergency Medicine

## 2021-07-14 ENCOUNTER — Emergency Department: Payer: 59

## 2021-07-14 ENCOUNTER — Other Ambulatory Visit: Payer: Self-pay

## 2021-07-14 DIAGNOSIS — R109 Unspecified abdominal pain: Secondary | ICD-10-CM | POA: Insufficient documentation

## 2021-07-14 DIAGNOSIS — M549 Dorsalgia, unspecified: Secondary | ICD-10-CM | POA: Diagnosis present

## 2021-07-14 DIAGNOSIS — X500XXA Overexertion from strenuous movement or load, initial encounter: Secondary | ICD-10-CM | POA: Insufficient documentation

## 2021-07-14 DIAGNOSIS — M5416 Radiculopathy, lumbar region: Secondary | ICD-10-CM | POA: Insufficient documentation

## 2021-07-14 LAB — HEPATIC FUNCTION PANEL
ALT: 12 U/L (ref 0–44)
AST: 16 U/L (ref 15–41)
Albumin: 4.2 g/dL (ref 3.5–5.0)
Alkaline Phosphatase: 56 U/L (ref 38–126)
Bilirubin, Direct: <0.1 mg/dL (ref 0.0–0.2)
Total Bilirubin: 0.4 mg/dL (ref 0.3–1.2)
Total Protein: 7.3 g/dL (ref 6.5–8.1)

## 2021-07-14 LAB — URINALYSIS, ROUTINE W REFLEX MICROSCOPIC
Bacteria, UA: NONE SEEN
Bilirubin Urine: NEGATIVE
Glucose, UA: NEGATIVE mg/dL
Hgb urine dipstick: NEGATIVE
Ketones, ur: NEGATIVE mg/dL
Leukocytes,Ua: NEGATIVE
Nitrite: NEGATIVE
Protein, ur: NEGATIVE mg/dL
Specific Gravity, Urine: 1.018 (ref 1.005–1.030)
pH: 6 (ref 5.0–8.0)

## 2021-07-14 LAB — CBC WITH DIFFERENTIAL/PLATELET
Abs Immature Granulocytes: 0.03 10*3/uL (ref 0.00–0.07)
Basophils Absolute: 0.1 10*3/uL (ref 0.0–0.1)
Basophils Relative: 1 %
Eosinophils Absolute: 0.1 10*3/uL (ref 0.0–0.5)
Eosinophils Relative: 1 %
HCT: 44 % (ref 36.0–46.0)
Hemoglobin: 14.6 g/dL (ref 12.0–15.0)
Immature Granulocytes: 0 %
Lymphocytes Relative: 20 %
Lymphs Abs: 1.9 10*3/uL (ref 0.7–4.0)
MCH: 30.6 pg (ref 26.0–34.0)
MCHC: 33.2 g/dL (ref 30.0–36.0)
MCV: 92.2 fL (ref 80.0–100.0)
Monocytes Absolute: 0.5 10*3/uL (ref 0.1–1.0)
Monocytes Relative: 6 %
Neutro Abs: 7 10*3/uL (ref 1.7–7.7)
Neutrophils Relative %: 72 %
Platelets: 218 10*3/uL (ref 150–400)
RBC: 4.77 MIL/uL (ref 3.87–5.11)
RDW: 13.2 % (ref 11.5–15.5)
WBC: 9.7 10*3/uL (ref 4.0–10.5)
nRBC: 0 % (ref 0.0–0.2)

## 2021-07-14 LAB — BASIC METABOLIC PANEL
Anion gap: 7 (ref 5–15)
BUN: 10 mg/dL (ref 6–20)
CO2: 25 mmol/L (ref 22–32)
Calcium: 9.7 mg/dL (ref 8.9–10.3)
Chloride: 107 mmol/L (ref 98–111)
Creatinine, Ser: 0.75 mg/dL (ref 0.44–1.00)
GFR, Estimated: >60 mL/min (ref >60)
Glucose, Bld: 108 mg/dL — ABNORMAL HIGH (ref 70–99)
Potassium: 3.7 mmol/L (ref 3.5–5.1)
Sodium: 139 mmol/L (ref 135–145)

## 2021-07-14 LAB — LIPASE, BLOOD: Lipase: 64 U/L — ABNORMAL HIGH (ref 11–51)

## 2021-07-14 LAB — POC URINE PREG, ED: Preg Test, Ur: NEGATIVE

## 2021-07-14 MED ORDER — KETOROLAC TROMETHAMINE 15 MG/ML IJ SOLN
15.0000 mg | Freq: Once | INTRAMUSCULAR | Status: AC
  Administered 2021-07-14: 15 mg via INTRAVENOUS
  Filled 2021-07-14: qty 1

## 2021-07-14 MED ORDER — OXYCODONE HCL 5 MG PO TABS: 5.0000 mg | ORAL_TABLET | Freq: Three times a day (TID) | ORAL | 0 refills | Status: AC | PRN

## 2021-07-14 MED ORDER — IOHEXOL 300 MG/ML  SOLN
100.0000 mL | Freq: Once | INTRAMUSCULAR | Status: AC | PRN
  Administered 2021-07-14: 100 mL via INTRAVENOUS

## 2021-07-14 MED ORDER — NAPROXEN 500 MG PO TABS: 500.0000 mg | ORAL_TABLET | Freq: Two times a day (BID) | ORAL | 0 refills | Status: AC

## 2021-07-14 MED ORDER — MORPHINE SULFATE (PF) 4 MG/ML IV SOLN
4.0000 mg | Freq: Once | INTRAVENOUS | Status: AC
  Administered 2021-07-14: 4 mg via INTRAVENOUS
  Filled 2021-07-14: qty 1

## 2021-07-14 MED ORDER — SODIUM CHLORIDE 0.9 % IV BOLUS
1000.0000 mL | Freq: Once | INTRAVENOUS | Status: AC
  Administered 2021-07-14: 1000 mL via INTRAVENOUS

## 2021-07-14 MED ORDER — ONDANSETRON HCL 4 MG/2ML IJ SOLN
4.0000 mg | Freq: Once | INTRAMUSCULAR | Status: AC
  Administered 2021-07-14: 4 mg via INTRAVENOUS
  Filled 2021-07-14: qty 2

## 2021-07-14 MED ORDER — PREDNISONE 10 MG (21) PO TBPK: ORAL_TABLET | ORAL | 0 refills | Status: AC

## 2022-05-21 IMAGING — CT CT RENAL STONE PROTOCOL
2 of 4 series · 17 of 46 positions shown, 19 images · non-contrast
Comparison: March 04, 2017.

CLINICAL DATA: Bilateral flank pain.

EXAM:
CT ABDOMEN AND PELVIS WITHOUT CONTRAST
TECHNIQUE: Multidetector CT imaging of the abdomen and pelvis was performed
following the standard protocol without IV contrast.

[Series 2: stone full standard · axial · 0.70mm/px · z∈[-519,-79]mm · 14 of 96 slices shown, 16 images]
[im 4/96  soft-tissue]
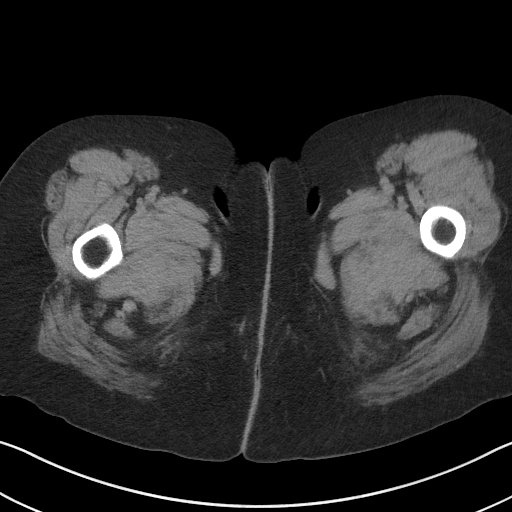
[im 4/96  bone]
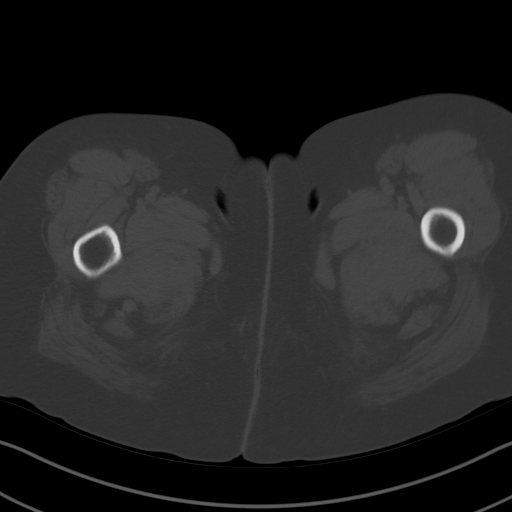
[im 12/96  soft-tissue]
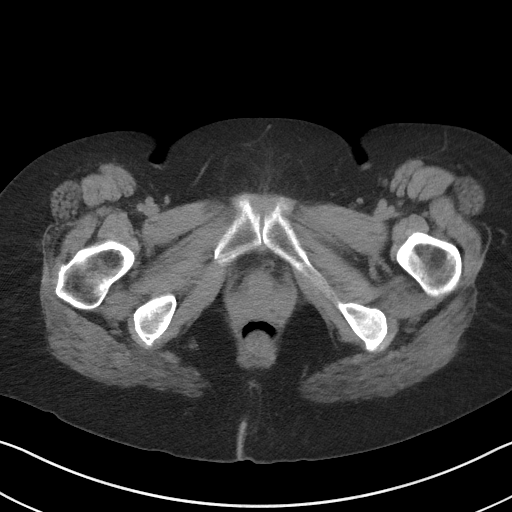
[im 20/96  soft-tissue]
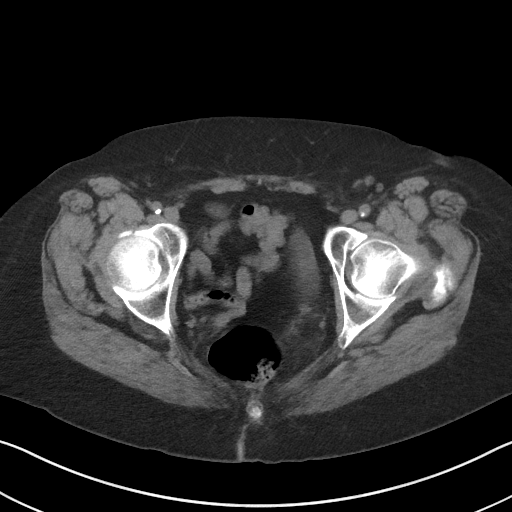
[im 24/96  soft-tissue]
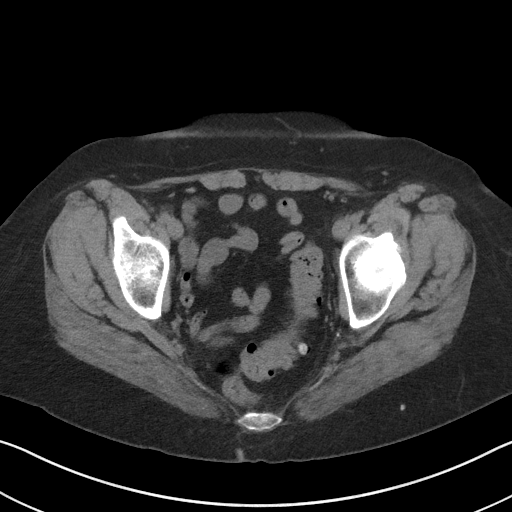
[im 32/96  soft-tissue]
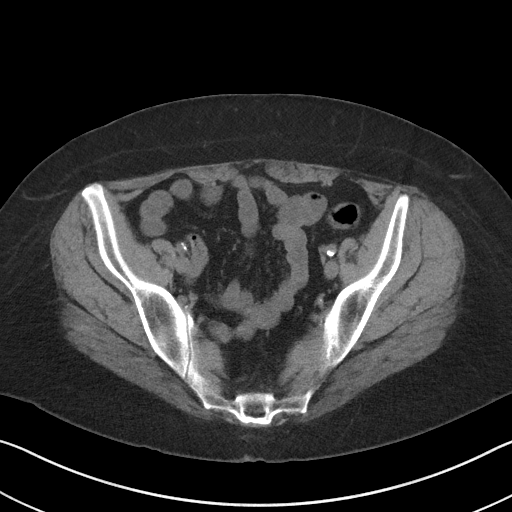
[im 40/96  soft-tissue]
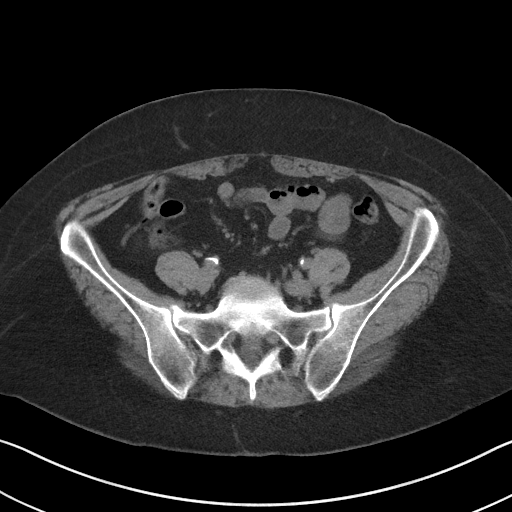
[im 44/96  soft-tissue]
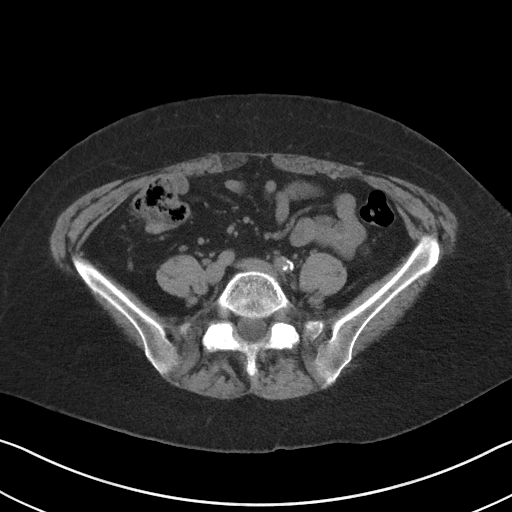
[im 52/96  soft-tissue]
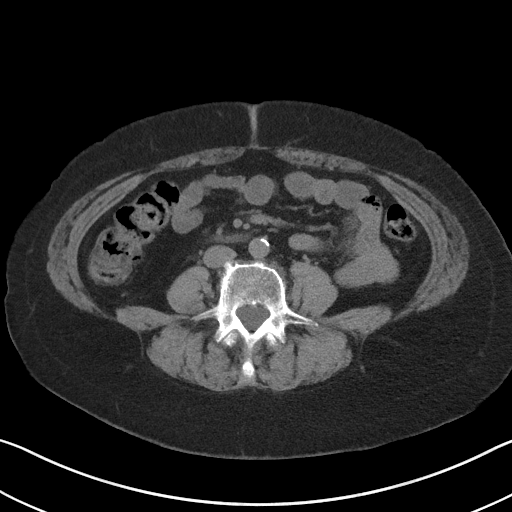
[im 56/96  soft-tissue]
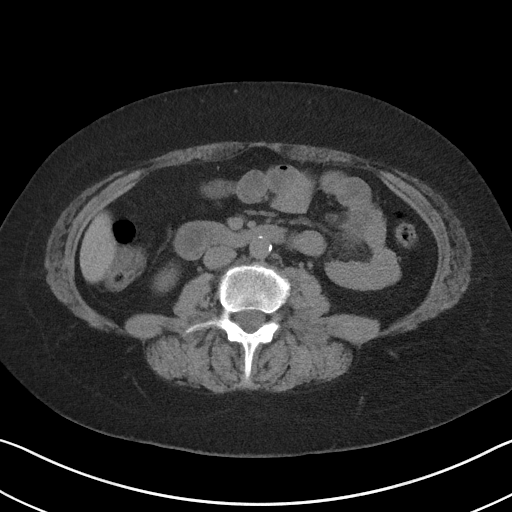
[im 56/96  bone]
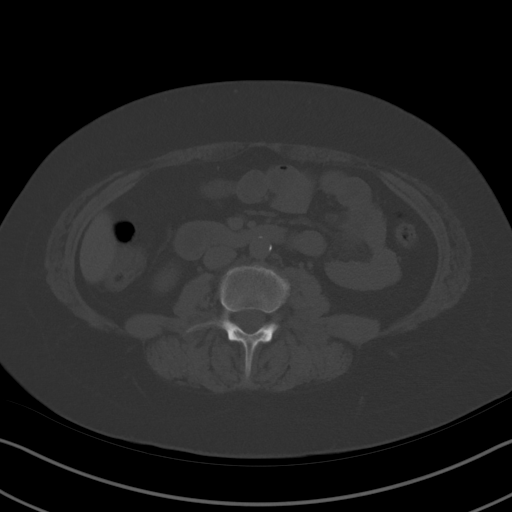
[im 64/96  soft-tissue]
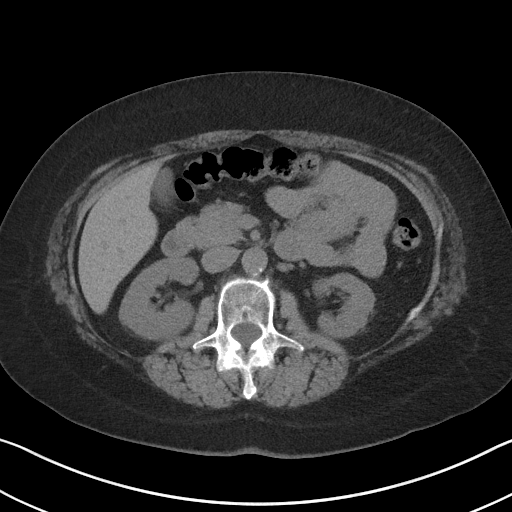
[im 72/96  soft-tissue]
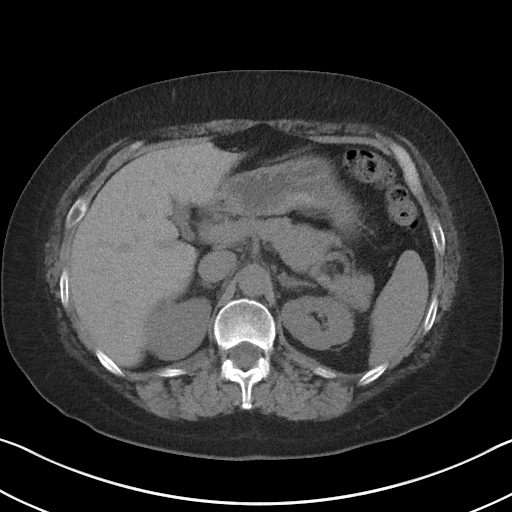
[im 76/96  soft-tissue]
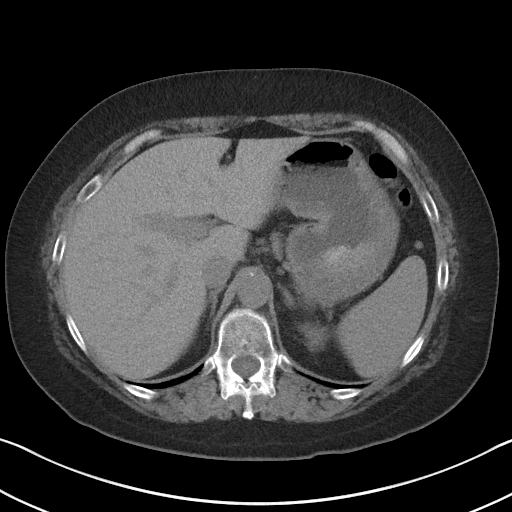
[im 84/96  soft-tissue]
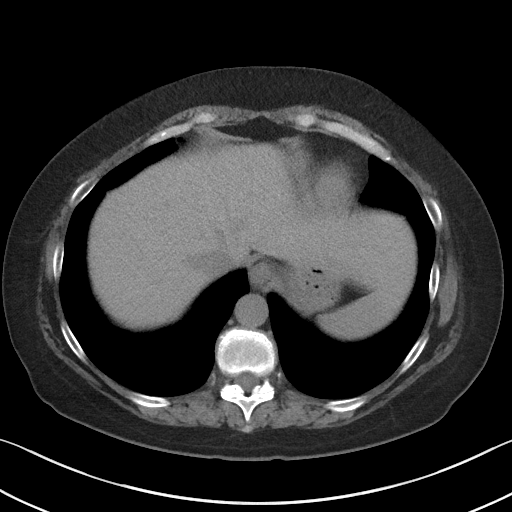
[im 92/96  soft-tissue]
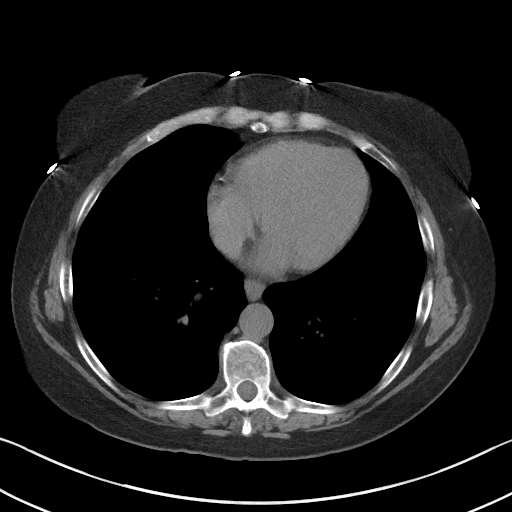

[Series 5: coronal · coronal · 0.84mm/px · 3 of 144 slices shown]
[im 48/144  soft-tissue]
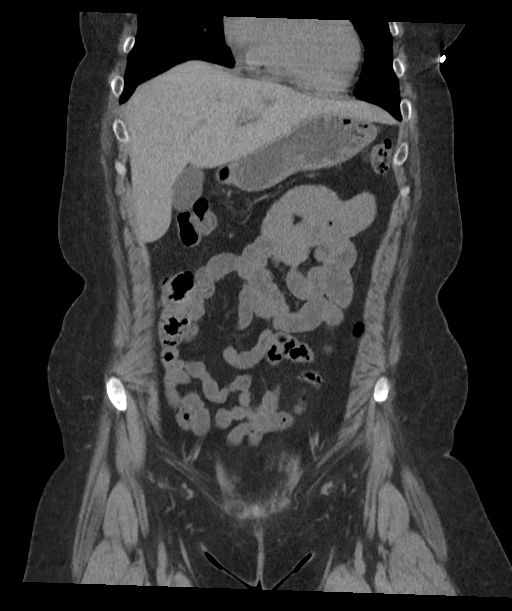
[im 64/144  soft-tissue]
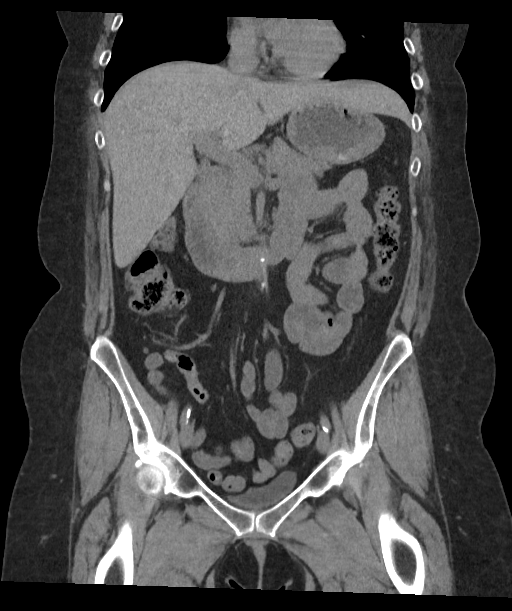
[im 80/144  soft-tissue]
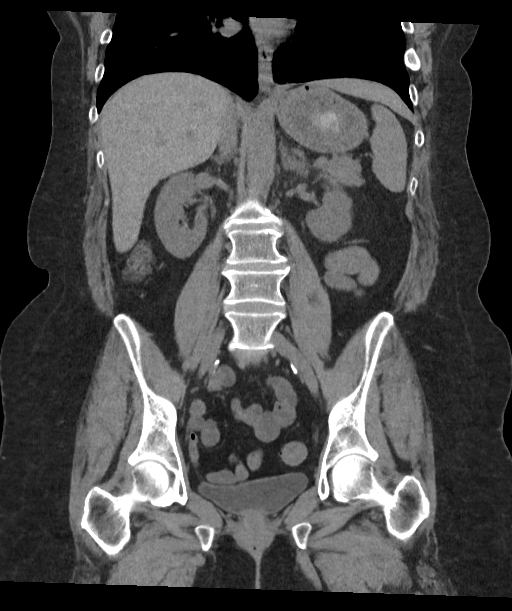

[17 of 46 positions shown; findings below may reference images not displayed]

FINDINGS: Lower chest: No acute abnormality.

Hepatobiliary: No gallstones or biliary dilatation is noted. Stable
left hepatic cyst.

Pancreas: Unremarkable. No pancreatic ductal dilatation or
surrounding inflammatory changes.

Spleen: Normal in size without focal abnormality.

Adrenals/Urinary Tract: Adrenal glands are unremarkable. Kidneys are
normal, without renal calculi, focal lesion, or hydronephrosis.
Bladder is unremarkable.

Stomach/Bowel: Stomach is within normal limits. Appendix appears
normal. No evidence of bowel wall thickening, distention, or
inflammatory changes. Sigmoid diverticulosis is noted without
inflammation.

Vascular/Lymphatic: Aortic atherosclerosis. No enlarged abdominal or
pelvic lymph nodes.

Reproductive: Status post hysterectomy. No adnexal masses.

Other: No abdominal wall hernia or abnormality. No abdominopelvic
ascites.

Musculoskeletal: No acute or significant osseous findings.
IMPRESSION: No acute abnormality seen in the abdomen or pelvis.
